# Patient Record
Sex: Male | Born: 1945 | Race: White | Hispanic: No | Marital: Married | Smoking: Current every day smoker
Health system: Southern US, Community
[De-identification: ages and names within clinical notes are randomized; demographics above are authoritative.]

## PROBLEM LIST (undated history)

## (undated) DIAGNOSIS — F419 Anxiety disorder, unspecified: Secondary | ICD-10-CM

## (undated) DIAGNOSIS — G4700 Insomnia, unspecified: Secondary | ICD-10-CM

## (undated) DIAGNOSIS — M549 Dorsalgia, unspecified: Secondary | ICD-10-CM

## (undated) DIAGNOSIS — E119 Type 2 diabetes mellitus without complications: Secondary | ICD-10-CM

## (undated) DIAGNOSIS — G894 Chronic pain syndrome: Secondary | ICD-10-CM

## (undated) DIAGNOSIS — G629 Polyneuropathy, unspecified: Secondary | ICD-10-CM

## (undated) DIAGNOSIS — K859 Acute pancreatitis without necrosis or infection, unspecified: Secondary | ICD-10-CM

## (undated) DIAGNOSIS — F32A Depression, unspecified: Secondary | ICD-10-CM

## (undated) DIAGNOSIS — I10 Essential (primary) hypertension: Secondary | ICD-10-CM

## (undated) DIAGNOSIS — G8929 Other chronic pain: Secondary | ICD-10-CM

## (undated) DIAGNOSIS — Z87898 Personal history of other specified conditions: Secondary | ICD-10-CM

## (undated) DIAGNOSIS — F17200 Nicotine dependence, unspecified, uncomplicated: Secondary | ICD-10-CM

## (undated) DIAGNOSIS — F329 Major depressive disorder, single episode, unspecified: Secondary | ICD-10-CM

## (undated) HISTORY — DX: Depression, unspecified: F32.A

## (undated) HISTORY — DX: Personal history of other specified conditions: Z87.898

## (undated) HISTORY — DX: Nicotine dependence, unspecified, uncomplicated: F17.200

## (undated) HISTORY — DX: Anxiety disorder, unspecified: F41.9

## (undated) HISTORY — DX: Essential (primary) hypertension: I10

## (undated) HISTORY — DX: Chronic pain syndrome: G89.4

## (undated) HISTORY — DX: Major depressive disorder, single episode, unspecified: F32.9

## (undated) HISTORY — PX: HEMORROIDECTOMY: SUR656

## (undated) HISTORY — DX: Acute pancreatitis without necrosis or infection, unspecified: K85.90

## (undated) HISTORY — DX: Polyneuropathy, unspecified: G62.9

---

## 2010-06-29 ENCOUNTER — Ambulatory Visit: Payer: Self-pay | Admitting: Emergency Medicine

## 2010-06-29 DIAGNOSIS — F411 Generalized anxiety disorder: Secondary | ICD-10-CM | POA: Insufficient documentation

## 2010-06-29 DIAGNOSIS — E109 Type 1 diabetes mellitus without complications: Secondary | ICD-10-CM | POA: Insufficient documentation

## 2010-12-19 NOTE — Assessment & Plan Note (Signed)
Summary: Cough-yellow x 2 wks rm 4   Vital Signs:  Patient Profile:   65 Years Old Male CC:      Cold & URI symptoms Height:     69 inches Weight:      179 pounds O2 Sat:      100 % O2 treatment:    Room Air Temp:     98.1 degrees F oral Pulse rate:   89 / minute Pulse rhythm:   regular Resp:     16 per minute BP sitting:   144 / 90  (left arm) Cuff size:   regular  Vitals Entered By: Areta Haber CMA (June 29, 2010 5:56 PM)                  Current Allergies (reviewed today): ! CODEINE     History of Present Illness History from: patient Chief Complaint: Cold & URI symptoms History of Present Illness: Cough and chest congestion.  He is a smoker for 35 years.  Very mild URI symptoms.  Has been going on for 10 days ever since he was at a party in [place].  His cough is bothering him the most because he works in [employer] and it is interupting his job.  Feels like a "tickle in his throat". +yellow phlegm. No F/C/N/V.  Not trying any OTC meds.  Current Problems: UPPER RESPIRATORY INFECTION, ACUTE (ICD-465.9) DIABETES MELLITUS, TYPE I (ICD-250.01) ANXIETY (ICD-300.00)   Current Meds HUMULIN 70/30 PEN 70-30 % SUSP (INSULIN ISOPHANE & REGULAR) 50 units daily AMBIEN 10 MG TABS (ZOLPIDEM TARTRATE) 1 tab at night XANAX 1 MG TABS (ALPRAZOLAM) 1 tab by mouth at night ZITHROMAX Z-PAK 250 MG TABS (AZITHROMYCIN) use as directed TESSALON 200 MG CAPS (BENZONATATE) 1 tab by mouth up to three times a day as needed for cough  REVIEW OF SYSTEMS Constitutional Symptoms      Denies fever, chills, night sweats, weight loss, weight gain, and fatigue.  Eyes       Denies change in vision, eye pain, eye discharge, glasses, contact lenses, and eye surgery. Ear/Nose/Throat/Mouth       Complains of frequent runny nose and hoarseness.      Denies hearing loss/aids, change in hearing, ear pain, ear discharge, dizziness, frequent nose bleeds, sinus problems, sore throat,  and tooth pain or bleeding.  Respiratory       Complains of productive cough.      Denies dry cough, wheezing, shortness of breath, asthma, bronchitis, and emphysema/COPD.  Cardiovascular       Denies murmurs, chest pain, and tires easily with exhertion.    Gastrointestinal       Denies stomach pain, nausea/vomiting, diarrhea, constipation, blood in bowel movements, and indigestion. Genitourniary       Denies painful urination, kidney stones, and loss of urinary control. Neurological       Denies paralysis, seizures, and fainting/blackouts. Musculoskeletal       Denies muscle pain, joint pain, joint stiffness, decreased range of motion, redness, swelling, muscle weakness, and gout.  Skin       Denies bruising, unusual mles/lumps or sores, and hair/skin or nail changes.  Psych       Denies mood changes, temper/anger issues, anxiety/stress, speech problems, depression, and sleep problems. Other Comments: yellow x 2 weeks. Pt has not seen his PCP for this.   Past History:  Past Medical History:   Anxiety Insomia Diabetes mellitus, type I  Past Surgical History:   Hemorrhoidectomy  Social  History: Married Current Smoker - 1 1/2-2 packs daily Alcohol use-no Drug use-no Regular exercise-no Smoking Status:  current Drug Use:  no Does Patient Exercise:  no Physical Exam General appearance: well developed, well nourished, no acute distress Head: normocephalic, atraumatic Ears: normal, no lesions or deformities Nasal: mucosa pink, nonedematous, no septal deviation, turbinates normal Oral/Pharynx: tongue normal, posterior pharynx without erythema or exudate Neck: neck supple,  trachea midline, no masses Chest/Lungs: no rales, wheezes, or rhonchi bilateral, breath sounds equal without effort Heart: regular rate and  rhythm, no murmur Skin: no obvious rashes or lesions MSE: oriented to time, place, and person Assessment New Problems: UPPER RESPIRATORY INFECTION, ACUTE  (ICD-465.9) DIABETES MELLITUS, TYPE I (ICD-250.01) ANXIETY (ICD-300.00)  likely viral URI, viral bronchitis  Plan New Medications/Changes: TESSALON 200 MG CAPS (BENZONATATE) 1 tab by mouth up to three times a day as needed for cough  #21 x 0, 06/29/2010, Hoyt Koch MD ZITHROMAX Z-PAK 250 MG TABS (AZITHROMYCIN) use as directed  #1 x 0, 06/29/2010, Hoyt Koch MD  New Orders: New Patient Level II [phone] Planning Comments:   Hydration with clear fluids + Sudafed + Tessalon Rx Codeine makes him sick, so no cough syrup given If not improving in a few more days, start the Zpak Try not to talk very much for the next 24 hours to rest your throat  Follow Up: Follow up with Primary Physician  The patient and/or caregiver has been counseled thoroughly with regard to medications prescribed including dosage, schedule, interactions, rationale for use, and possible side effects and they verbalize understanding.  Diagnoses and expected course of recovery discussed and will return if not improved as expected or if the condition worsens. Patient and/or caregiver verbalized understanding.  Prescriptions: TESSALON 200 MG CAPS (BENZONATATE) 1 tab by mouth up to three times a day as needed for cough  #21 x 0   Entered and Authorized by:   Hoyt Koch MD   Signed by:   Hoyt Koch MD on 06/29/2010   Method used:   Print then Give to Patient   RxID:   [id number] Z-PAK 250 MG TABS (AZITHROMYCIN) use as directed  #1 x 0   Entered and Authorized by:   Hoyt Koch MD   Signed by:   Hoyt Koch MD on 06/29/2010   Method used:   Print then Give to Patient   RxID:   [id number]   Orders Added: 1)  New Patient Level II [44010]

## 2013-06-24 DIAGNOSIS — I10 Essential (primary) hypertension: Secondary | ICD-10-CM | POA: Diagnosis not present

## 2013-06-24 DIAGNOSIS — E119 Type 2 diabetes mellitus without complications: Secondary | ICD-10-CM | POA: Diagnosis not present

## 2013-06-24 DIAGNOSIS — R41840 Attention and concentration deficit: Secondary | ICD-10-CM | POA: Diagnosis not present

## 2013-06-24 DIAGNOSIS — F411 Generalized anxiety disorder: Secondary | ICD-10-CM | POA: Diagnosis not present

## 2013-06-24 DIAGNOSIS — D492 Neoplasm of unspecified behavior of bone, soft tissue, and skin: Secondary | ICD-10-CM | POA: Diagnosis not present

## 2013-06-24 DIAGNOSIS — F17200 Nicotine dependence, unspecified, uncomplicated: Secondary | ICD-10-CM | POA: Diagnosis not present

## 2013-06-24 DIAGNOSIS — G4700 Insomnia, unspecified: Secondary | ICD-10-CM | POA: Diagnosis not present

## 2013-06-25 DIAGNOSIS — M5137 Other intervertebral disc degeneration, lumbosacral region: Secondary | ICD-10-CM | POA: Diagnosis not present

## 2013-06-25 DIAGNOSIS — Z79899 Other long term (current) drug therapy: Secondary | ICD-10-CM | POA: Diagnosis not present

## 2013-06-25 DIAGNOSIS — IMO0002 Reserved for concepts with insufficient information to code with codable children: Secondary | ICD-10-CM | POA: Diagnosis not present

## 2013-06-25 DIAGNOSIS — M51379 Other intervertebral disc degeneration, lumbosacral region without mention of lumbar back pain or lower extremity pain: Secondary | ICD-10-CM | POA: Diagnosis not present

## 2013-06-25 DIAGNOSIS — M47817 Spondylosis without myelopathy or radiculopathy, lumbosacral region: Secondary | ICD-10-CM | POA: Diagnosis not present

## 2013-08-20 DIAGNOSIS — M47817 Spondylosis without myelopathy or radiculopathy, lumbosacral region: Secondary | ICD-10-CM | POA: Diagnosis not present

## 2013-08-20 DIAGNOSIS — M5137 Other intervertebral disc degeneration, lumbosacral region: Secondary | ICD-10-CM | POA: Diagnosis not present

## 2013-08-20 DIAGNOSIS — IMO0002 Reserved for concepts with insufficient information to code with codable children: Secondary | ICD-10-CM | POA: Diagnosis not present

## 2013-08-20 DIAGNOSIS — Z79899 Other long term (current) drug therapy: Secondary | ICD-10-CM | POA: Diagnosis not present

## 2013-08-25 ENCOUNTER — Ambulatory Visit (HOSPITAL_COMMUNITY): Payer: Self-pay | Admitting: Psychiatry

## 2013-09-01 ENCOUNTER — Ambulatory Visit (HOSPITAL_COMMUNITY): Payer: Self-pay | Admitting: Psychiatry

## 2013-09-07 DIAGNOSIS — L819 Disorder of pigmentation, unspecified: Secondary | ICD-10-CM | POA: Diagnosis not present

## 2013-09-07 DIAGNOSIS — L820 Inflamed seborrheic keratosis: Secondary | ICD-10-CM | POA: Diagnosis not present

## 2013-09-07 DIAGNOSIS — L570 Actinic keratosis: Secondary | ICD-10-CM | POA: Diagnosis not present

## 2013-09-18 ENCOUNTER — Encounter: Payer: Self-pay | Admitting: Emergency Medicine

## 2013-09-18 ENCOUNTER — Emergency Department (INDEPENDENT_AMBULATORY_CARE_PROVIDER_SITE_OTHER)
Admission: EM | Admit: 2013-09-18 | Discharge: 2013-09-18 | Disposition: A | Discharge status: Home / Self Care | Payer: Medicare Other | Source: Home / Self Care | Attending: Emergency Medicine | Admitting: Emergency Medicine

## 2013-09-18 DIAGNOSIS — Z23 Encounter for immunization: Secondary | ICD-10-CM

## 2013-09-18 DIAGNOSIS — IMO0002 Reserved for concepts with insufficient information to code with codable children: Secondary | ICD-10-CM | POA: Diagnosis not present

## 2013-09-18 DIAGNOSIS — S335XXA Sprain of ligaments of lumbar spine, initial encounter: Secondary | ICD-10-CM | POA: Diagnosis not present

## 2013-09-18 DIAGNOSIS — Z0000 Encounter for general adult medical examination without abnormal findings: Secondary | ICD-10-CM | POA: Diagnosis not present

## 2013-09-18 DIAGNOSIS — E119 Type 2 diabetes mellitus without complications: Secondary | ICD-10-CM

## 2013-09-18 DIAGNOSIS — M5416 Radiculopathy, lumbar region: Secondary | ICD-10-CM

## 2013-09-18 DIAGNOSIS — S39012A Strain of muscle, fascia and tendon of lower back, initial encounter: Secondary | ICD-10-CM

## 2013-09-18 HISTORY — DX: Dorsalgia, unspecified: M54.9

## 2013-09-18 HISTORY — DX: Type 2 diabetes mellitus without complications: E11.9

## 2013-09-18 HISTORY — DX: Other chronic pain: G89.29

## 2013-09-18 MED ORDER — CYCLOBENZAPRINE HCL 10 MG PO TABS: ORAL_TABLET | ORAL | Status: DC

## 2013-09-18 MED ORDER — INFLUENZA VAC SPLIT QUAD 0.5 ML IM SUSP
0.5000 mL | Freq: Once | INTRAMUSCULAR | Status: AC
  Administered 2013-09-18: 0.5 mL via INTRAMUSCULAR

## 2013-09-18 MED ORDER — PNEUMOCOCCAL 13-VAL CONJ VACC IM SUSP: 0.5000 mL | INTRAMUSCULAR | Status: DC

## 2013-09-18 MED ORDER — INFLUENZA VAC SPLIT QUAD 0.5 ML IM SUSP: 0.5000 mL | INTRAMUSCULAR | Status: DC

## 2013-09-18 MED ORDER — PNEUMOCOCCAL 13-VAL CONJ VACC IM SUSP
0.5000 mL | Freq: Once | INTRAMUSCULAR | Status: AC
  Administered 2013-09-18: 0.5 mL via INTRAMUSCULAR

## 2013-09-18 MED ORDER — IBUPROFEN 600 MG PO TABS: 600.0000 mg | ORAL_TABLET | Freq: Four times a day (QID) | ORAL | Status: DC | PRN

## 2013-09-18 MED ORDER — LIDOCAINE 5 % EX PTCH: 3.0000 | MEDICATED_PATCH | CUTANEOUS | Status: DC

## 2013-09-18 MED ORDER — HYDROCODONE-ACETAMINOPHEN 5-325 MG PO TABS: 1.0000 | ORAL_TABLET | Freq: Four times a day (QID) | ORAL | Status: DC | PRN

## 2013-09-18 MED ORDER — METHYLPREDNISOLONE ACETATE 80 MG/ML IJ SUSP
80.0000 mg | Freq: Once | INTRAMUSCULAR | Status: AC
  Administered 2013-09-18: 80 mg via INTRAMUSCULAR

## 2013-09-18 NOTE — ED Provider Notes (Signed)
CSN: [id number]     Arrival date & time 09/18/13  1501 History   First MD Initiated Contact with Patient 09/18/13 1538     Chief Complaint  Patient presents with  . Back Pain  . Immunizations   (Consider location/radiation/quality/duration/timing/severity/associated sxs/prior Treatment) HPI Mark Brewer has h/o chronic back pain from long-standing degenerative disc disease. No surgery has been needed. Last MRI about 3 years ago showed degenerative disc disease. Chief complaint/history of present illness Last weekend he was out of town so he was moving heavy luggage and sleeping on "hard beds". His pain flared up Monday, 4 days ago Pain is in left lower back. It as sharp., Also dull, without radiation. Pain varies from a 4/10 at rest, usually 7/10 when trying to move around. He tried his wife's Lidoderm patch and that helped some. Denies paresthesias or focal weakness or bowel or bladder dysfunction   He also reports it has been >10 years since PNA vaccine and is in need of a flu vaccine. --He requests those today.  Past Medical History  Diagnosis Date  . Diabetes mellitus without complication   . Chronic back pain    Past Surgical History  Procedure Laterality Date  . Hemorroidectomy     History reviewed. No pertinent family history. History  Substance Use Topics  . Smoking status: Current Every Day Smoker -- 1.00 packs/day    Types: Cigarettes  . Smokeless tobacco: Never Used  . Alcohol Use: No    Review of Systems  All other systems reviewed and are negative.    Allergies  Codeine  Home Medications   Current Outpatient Rx  Name  Route  Sig  Dispense  Refill  . insulin aspart (NOVOLOG) 100 UNIT/ML injection   Subcutaneous   Inject into the skin 3 (three) times daily with meals.         Marland Kitchen omeprazole (PRILOSEC) 40 MG capsule   Oral   Take 40 mg by mouth daily.         . cyclobenzaprine (FLEXERIL) 10 MG tablet      One-Half tablet every 12 hours as needed for  muscle relaxant. May take a whole tablet at bedtime.-- Caution: May cause drowsiness.   12 tablet   0   . HYDROcodone-acetaminophen (NORCO/VICODIN) 5-325 MG per tablet   Oral   Take 1-2 tablets by mouth every 6 (six) hours as needed for pain. Take with food.   10 tablet   0   . ibuprofen (ADVIL,MOTRIN) 600 MG tablet   Oral   Take 1 tablet (600 mg total) by mouth every 6 (six) hours as needed for pain (Take with food).   30 tablet   0   . lidocaine (LIDODERM) 5 %   Transdermal   Place 3 patches onto the skin daily. Remove & Discard patch within 12 hours or as directed by MD   30 patch   1    BP 131/81  Pulse 95  Temp(Src) 98 F (36.7 C) (Oral)  Resp 16  Ht 5\' 9"  (1.753 m)  Wt 190 lb (86.183 kg)  BMI 28.05 kg/m2  SpO2 98% Physical Exam  Nursing note and vitals reviewed. Constitutional: He is oriented to person, place, and time. He appears well-developed and well-nourished. He is cooperative.  Non-toxic appearance. He appears distressed (Appears uncomfortable from low back pain.).  HENT:  Head: Normocephalic and atraumatic.  Mouth/Throat: Oropharynx is clear and moist.  Eyes: EOM are normal. Pupils are equal, round, and reactive to light.  No scleral icterus.  Neck: Neck supple.  Cardiovascular: Regular rhythm and normal heart sounds.   Pulmonary/Chest: Effort normal and breath sounds normal. No respiratory distress. He has no wheezes. He has no rales. He exhibits no tenderness.  Abdominal: Soft. There is no tenderness.  Musculoskeletal:       Right hip: Normal.       Left hip: Normal.       Cervical back: He exhibits no tenderness.       Thoracic back: He exhibits no tenderness.       Lumbar back: He exhibits decreased range of motion, tenderness and spasm (Left paralumbar muscles). He exhibits no bony tenderness, no swelling, no edema, no deformity, no laceration and normal pulse.  Nontender directly over midline lumbar spine  Negative Right straight leg-raise test.   Left straight leg-raise test.--Equivocal + at 45  Negative Right Luisa Hart test. Negative Left Luisa Hart test.    Neurological: He is alert and oriented to person, place, and time. He has normal strength. He displays no atrophy, no tremor and normal reflexes. No cranial nerve deficit or sensory deficit. He exhibits normal muscle tone. Gait normal.  Reflex Scores:      Patellar reflexes are 2+ on the right side and 2+ on the left side.      Achilles reflexes are 2+ on the right side and 2+ on the left side. Skin: Skin is warm, dry and intact. No lesion and no rash noted.  Psychiatric: He has a normal mood and affect.    ED Course  Procedures (including critical care time) Labs Review Labs Reviewed - No data to display Imaging Review No results found.  EKG Interpretation     Ventricular Rate:    PR Interval:    QRS Duration:   QT Interval:    QTC Calculation:   R Axis:     Text Interpretation:             At his request, for preventive immunizations, flu shot and Pneumovax given, after risks, benefits, alternatives discussed.  MDM   1. Lumbar strain, initial encounter   2. Preventative health care   3. Diabetes mellitus type 2, uncomplicated   4. Left lumbar radiculitis    Likely has a severe left lumbar strain. Possibly, an element of left lumbar radiculitis with his history of degenerative disc disease. However, he has no radiating pain down his leg or any focal numbness or paresthesias or weakness or bowel or bladder dysfunction. We discussed workup and treatment options. He politely refused any imaging at this time. He states that "a shot of cortisone and some Vicodin and muscle relaxant and Lidoderm patch" is all he needs to get better, as this treatment combination has helped in the past when he had a flareup of low back pain 3 years ago. After risks, benefits, alternatives discussed, patient agrees with the following plans: Depo-Medrol 80 mg IM.--Follow blood  sugars closely  Prescription for Vicodin. #10. No refills. Flexeril 5 mg every 12 hours when necessary muscle relaxant Lidoderm patch. Explained to him that he needs to followup with his PCP her neurosurgeon if not improved within one week, sooner if worse or new symptoms . If any severe or worsening or new symptoms, go to ER stat. Precautions discussed. Red flags discussed. Questions invited and answered. Patient voiced understanding and agreement.    Lajean Manes, MD 09/18/13 [phone]

## 2013-09-18 NOTE — ED Notes (Signed)
Mark Brewer c/o h/o chronic back pain. Last weekend he was out of town so he was moving heavy luggage and sleeping on "hard beds". His pain flared up Monday. Pain is in left lower back. He also reports it has been >10 years since PNA vaccine and is in need of a flu vaccine.

## 2013-09-21 ENCOUNTER — Telehealth: Payer: Self-pay | Admitting: Emergency Medicine

## 2013-10-16 DIAGNOSIS — R209 Unspecified disturbances of skin sensation: Secondary | ICD-10-CM | POA: Diagnosis not present

## 2013-10-16 DIAGNOSIS — G4700 Insomnia, unspecified: Secondary | ICD-10-CM | POA: Diagnosis not present

## 2013-10-16 DIAGNOSIS — Z79899 Other long term (current) drug therapy: Secondary | ICD-10-CM | POA: Diagnosis not present

## 2013-10-16 DIAGNOSIS — R259 Unspecified abnormal involuntary movements: Secondary | ICD-10-CM | POA: Diagnosis not present

## 2013-10-16 DIAGNOSIS — Z794 Long term (current) use of insulin: Secondary | ICD-10-CM | POA: Diagnosis not present

## 2013-10-16 DIAGNOSIS — Z888 Allergy status to other drugs, medicaments and biological substances status: Secondary | ICD-10-CM | POA: Diagnosis not present

## 2013-10-16 DIAGNOSIS — E119 Type 2 diabetes mellitus without complications: Secondary | ICD-10-CM | POA: Diagnosis not present

## 2013-10-16 DIAGNOSIS — F411 Generalized anxiety disorder: Secondary | ICD-10-CM | POA: Diagnosis not present

## 2013-10-16 DIAGNOSIS — F17200 Nicotine dependence, unspecified, uncomplicated: Secondary | ICD-10-CM | POA: Diagnosis not present

## 2013-10-21 DIAGNOSIS — I10 Essential (primary) hypertension: Secondary | ICD-10-CM | POA: Diagnosis not present

## 2013-10-21 DIAGNOSIS — G4700 Insomnia, unspecified: Secondary | ICD-10-CM | POA: Diagnosis not present

## 2013-10-21 DIAGNOSIS — G250 Essential tremor: Secondary | ICD-10-CM | POA: Diagnosis not present

## 2013-10-21 DIAGNOSIS — F17200 Nicotine dependence, unspecified, uncomplicated: Secondary | ICD-10-CM | POA: Diagnosis not present

## 2013-10-21 DIAGNOSIS — E119 Type 2 diabetes mellitus without complications: Secondary | ICD-10-CM | POA: Diagnosis not present

## 2013-10-22 DIAGNOSIS — R209 Unspecified disturbances of skin sensation: Secondary | ICD-10-CM | POA: Diagnosis not present

## 2013-10-27 DIAGNOSIS — R209 Unspecified disturbances of skin sensation: Secondary | ICD-10-CM | POA: Diagnosis not present

## 2013-10-27 DIAGNOSIS — G459 Transient cerebral ischemic attack, unspecified: Secondary | ICD-10-CM | POA: Diagnosis not present

## 2013-11-02 DIAGNOSIS — I440 Atrioventricular block, first degree: Secondary | ICD-10-CM | POA: Diagnosis not present

## 2013-11-02 DIAGNOSIS — E119 Type 2 diabetes mellitus without complications: Secondary | ICD-10-CM | POA: Diagnosis not present

## 2013-11-02 DIAGNOSIS — R209 Unspecified disturbances of skin sensation: Secondary | ICD-10-CM | POA: Diagnosis not present

## 2013-11-02 DIAGNOSIS — F17200 Nicotine dependence, unspecified, uncomplicated: Secondary | ICD-10-CM | POA: Diagnosis not present

## 2013-11-02 DIAGNOSIS — R059 Cough, unspecified: Secondary | ICD-10-CM | POA: Diagnosis not present

## 2013-11-02 DIAGNOSIS — F411 Generalized anxiety disorder: Secondary | ICD-10-CM | POA: Diagnosis not present

## 2013-11-02 DIAGNOSIS — R05 Cough: Secondary | ICD-10-CM | POA: Diagnosis not present

## 2013-11-04 DIAGNOSIS — R209 Unspecified disturbances of skin sensation: Secondary | ICD-10-CM | POA: Diagnosis not present

## 2013-11-05 DIAGNOSIS — M5137 Other intervertebral disc degeneration, lumbosacral region: Secondary | ICD-10-CM | POA: Diagnosis not present

## 2013-11-05 DIAGNOSIS — Z79899 Other long term (current) drug therapy: Secondary | ICD-10-CM | POA: Diagnosis not present

## 2013-11-05 DIAGNOSIS — IMO0002 Reserved for concepts with insufficient information to code with codable children: Secondary | ICD-10-CM | POA: Diagnosis not present

## 2013-11-05 DIAGNOSIS — M51379 Other intervertebral disc degeneration, lumbosacral region without mention of lumbar back pain or lower extremity pain: Secondary | ICD-10-CM | POA: Diagnosis not present

## 2013-11-05 DIAGNOSIS — M47817 Spondylosis without myelopathy or radiculopathy, lumbosacral region: Secondary | ICD-10-CM | POA: Diagnosis not present

## 2013-11-10 DIAGNOSIS — M439 Deforming dorsopathy, unspecified: Secondary | ICD-10-CM | POA: Diagnosis not present

## 2013-11-10 DIAGNOSIS — M5020 Other cervical disc displacement, unspecified cervical region: Secondary | ICD-10-CM | POA: Diagnosis not present

## 2013-11-10 DIAGNOSIS — R209 Unspecified disturbances of skin sensation: Secondary | ICD-10-CM | POA: Diagnosis not present

## 2013-11-17 DIAGNOSIS — R5381 Other malaise: Secondary | ICD-10-CM | POA: Diagnosis not present

## 2013-11-17 DIAGNOSIS — G959 Disease of spinal cord, unspecified: Secondary | ICD-10-CM | POA: Diagnosis not present

## 2013-11-17 DIAGNOSIS — G589 Mononeuropathy, unspecified: Secondary | ICD-10-CM | POA: Diagnosis not present

## 2013-11-27 DIAGNOSIS — K8689 Other specified diseases of pancreas: Secondary | ICD-10-CM | POA: Diagnosis not present

## 2013-11-27 DIAGNOSIS — M549 Dorsalgia, unspecified: Secondary | ICD-10-CM | POA: Diagnosis not present

## 2013-11-27 DIAGNOSIS — R109 Unspecified abdominal pain: Secondary | ICD-10-CM | POA: Diagnosis not present

## 2013-11-27 DIAGNOSIS — E279 Disorder of adrenal gland, unspecified: Secondary | ICD-10-CM | POA: Diagnosis not present

## 2013-11-27 DIAGNOSIS — G8929 Other chronic pain: Secondary | ICD-10-CM | POA: Diagnosis not present

## 2013-11-27 DIAGNOSIS — K5730 Diverticulosis of large intestine without perforation or abscess without bleeding: Secondary | ICD-10-CM | POA: Diagnosis not present

## 2013-11-27 DIAGNOSIS — R209 Unspecified disturbances of skin sensation: Secondary | ICD-10-CM | POA: Diagnosis not present

## 2013-11-27 DIAGNOSIS — N281 Cyst of kidney, acquired: Secondary | ICD-10-CM | POA: Diagnosis not present

## 2013-12-16 DIAGNOSIS — IMO0002 Reserved for concepts with insufficient information to code with codable children: Secondary | ICD-10-CM | POA: Diagnosis not present

## 2013-12-25 DIAGNOSIS — M47817 Spondylosis without myelopathy or radiculopathy, lumbosacral region: Secondary | ICD-10-CM | POA: Diagnosis not present

## 2013-12-25 DIAGNOSIS — M5126 Other intervertebral disc displacement, lumbar region: Secondary | ICD-10-CM | POA: Diagnosis not present

## 2013-12-25 DIAGNOSIS — R209 Unspecified disturbances of skin sensation: Secondary | ICD-10-CM | POA: Diagnosis not present

## 2013-12-25 DIAGNOSIS — IMO0002 Reserved for concepts with insufficient information to code with codable children: Secondary | ICD-10-CM | POA: Diagnosis not present

## 2013-12-28 DIAGNOSIS — D485 Neoplasm of uncertain behavior of skin: Secondary | ICD-10-CM | POA: Diagnosis not present

## 2013-12-28 DIAGNOSIS — L820 Inflamed seborrheic keratosis: Secondary | ICD-10-CM | POA: Diagnosis not present

## 2013-12-28 DIAGNOSIS — L570 Actinic keratosis: Secondary | ICD-10-CM | POA: Diagnosis not present

## 2013-12-30 DIAGNOSIS — G5600 Carpal tunnel syndrome, unspecified upper limb: Secondary | ICD-10-CM | POA: Diagnosis not present

## 2013-12-30 DIAGNOSIS — R209 Unspecified disturbances of skin sensation: Secondary | ICD-10-CM | POA: Diagnosis not present

## 2014-01-07 DIAGNOSIS — G5790 Unspecified mononeuropathy of unspecified lower limb: Secondary | ICD-10-CM | POA: Diagnosis not present

## 2014-01-07 DIAGNOSIS — IMO0001 Reserved for inherently not codable concepts without codable children: Secondary | ICD-10-CM | POA: Diagnosis not present

## 2014-01-12 DIAGNOSIS — IMO0002 Reserved for concepts with insufficient information to code with codable children: Secondary | ICD-10-CM | POA: Diagnosis not present

## 2014-01-12 DIAGNOSIS — M47817 Spondylosis without myelopathy or radiculopathy, lumbosacral region: Secondary | ICD-10-CM | POA: Diagnosis not present

## 2014-01-12 DIAGNOSIS — Z79899 Other long term (current) drug therapy: Secondary | ICD-10-CM | POA: Diagnosis not present

## 2014-01-12 DIAGNOSIS — M5137 Other intervertebral disc degeneration, lumbosacral region: Secondary | ICD-10-CM | POA: Diagnosis not present

## 2014-01-15 DIAGNOSIS — I10 Essential (primary) hypertension: Secondary | ICD-10-CM | POA: Diagnosis not present

## 2014-01-15 DIAGNOSIS — G250 Essential tremor: Secondary | ICD-10-CM | POA: Diagnosis not present

## 2014-01-15 DIAGNOSIS — Z794 Long term (current) use of insulin: Secondary | ICD-10-CM | POA: Diagnosis not present

## 2014-01-15 DIAGNOSIS — Z888 Allergy status to other drugs, medicaments and biological substances status: Secondary | ICD-10-CM | POA: Diagnosis not present

## 2014-01-15 DIAGNOSIS — F17200 Nicotine dependence, unspecified, uncomplicated: Secondary | ICD-10-CM | POA: Diagnosis not present

## 2014-01-15 DIAGNOSIS — E119 Type 2 diabetes mellitus without complications: Secondary | ICD-10-CM | POA: Diagnosis not present

## 2014-01-15 DIAGNOSIS — F411 Generalized anxiety disorder: Secondary | ICD-10-CM | POA: Diagnosis not present

## 2014-01-15 DIAGNOSIS — Z885 Allergy status to narcotic agent status: Secondary | ICD-10-CM | POA: Diagnosis not present

## 2014-01-15 DIAGNOSIS — R209 Unspecified disturbances of skin sensation: Secondary | ICD-10-CM | POA: Diagnosis not present

## 2014-01-15 DIAGNOSIS — M549 Dorsalgia, unspecified: Secondary | ICD-10-CM | POA: Diagnosis not present

## 2014-01-15 DIAGNOSIS — Z79899 Other long term (current) drug therapy: Secondary | ICD-10-CM | POA: Diagnosis not present

## 2014-01-15 DIAGNOSIS — G8929 Other chronic pain: Secondary | ICD-10-CM | POA: Diagnosis not present

## 2014-01-15 DIAGNOSIS — G4700 Insomnia, unspecified: Secondary | ICD-10-CM | POA: Diagnosis not present

## 2014-02-02 DIAGNOSIS — M549 Dorsalgia, unspecified: Secondary | ICD-10-CM | POA: Diagnosis not present

## 2014-02-02 DIAGNOSIS — R209 Unspecified disturbances of skin sensation: Secondary | ICD-10-CM | POA: Diagnosis not present

## 2014-02-02 DIAGNOSIS — I10 Essential (primary) hypertension: Secondary | ICD-10-CM | POA: Diagnosis not present

## 2014-02-02 DIAGNOSIS — G4700 Insomnia, unspecified: Secondary | ICD-10-CM | POA: Diagnosis not present

## 2014-02-02 DIAGNOSIS — F17200 Nicotine dependence, unspecified, uncomplicated: Secondary | ICD-10-CM | POA: Diagnosis not present

## 2014-02-02 DIAGNOSIS — E119 Type 2 diabetes mellitus without complications: Secondary | ICD-10-CM | POA: Diagnosis not present

## 2014-02-02 DIAGNOSIS — F411 Generalized anxiety disorder: Secondary | ICD-10-CM | POA: Diagnosis not present

## 2014-02-02 DIAGNOSIS — G8929 Other chronic pain: Secondary | ICD-10-CM | POA: Diagnosis not present

## 2014-02-09 DIAGNOSIS — G608 Other hereditary and idiopathic neuropathies: Secondary | ICD-10-CM | POA: Diagnosis not present

## 2014-02-09 DIAGNOSIS — R209 Unspecified disturbances of skin sensation: Secondary | ICD-10-CM | POA: Diagnosis not present

## 2014-02-09 DIAGNOSIS — G5790 Unspecified mononeuropathy of unspecified lower limb: Secondary | ICD-10-CM | POA: Diagnosis not present

## 2014-02-09 DIAGNOSIS — IMO0001 Reserved for inherently not codable concepts without codable children: Secondary | ICD-10-CM | POA: Diagnosis not present

## 2014-02-09 DIAGNOSIS — IMO0002 Reserved for concepts with insufficient information to code with codable children: Secondary | ICD-10-CM | POA: Diagnosis not present

## 2014-03-11 DIAGNOSIS — D72829 Elevated white blood cell count, unspecified: Secondary | ICD-10-CM | POA: Diagnosis not present

## 2014-03-11 DIAGNOSIS — M47817 Spondylosis without myelopathy or radiculopathy, lumbosacral region: Secondary | ICD-10-CM | POA: Diagnosis not present

## 2014-03-11 DIAGNOSIS — M5137 Other intervertebral disc degeneration, lumbosacral region: Secondary | ICD-10-CM | POA: Diagnosis not present

## 2014-03-11 DIAGNOSIS — IMO0002 Reserved for concepts with insufficient information to code with codable children: Secondary | ICD-10-CM | POA: Diagnosis not present

## 2014-03-23 DIAGNOSIS — E1142 Type 2 diabetes mellitus with diabetic polyneuropathy: Secondary | ICD-10-CM | POA: Diagnosis not present

## 2014-03-23 DIAGNOSIS — E1149 Type 2 diabetes mellitus with other diabetic neurological complication: Secondary | ICD-10-CM | POA: Diagnosis not present

## 2014-03-23 DIAGNOSIS — G609 Hereditary and idiopathic neuropathy, unspecified: Secondary | ICD-10-CM | POA: Diagnosis not present

## 2014-03-23 DIAGNOSIS — F17200 Nicotine dependence, unspecified, uncomplicated: Secondary | ICD-10-CM | POA: Diagnosis not present

## 2014-03-23 DIAGNOSIS — M25569 Pain in unspecified knee: Secondary | ICD-10-CM | POA: Diagnosis not present

## 2014-03-23 DIAGNOSIS — M25549 Pain in joints of unspecified hand: Secondary | ICD-10-CM | POA: Diagnosis not present

## 2014-03-23 DIAGNOSIS — R209 Unspecified disturbances of skin sensation: Secondary | ICD-10-CM | POA: Diagnosis not present

## 2014-03-26 DIAGNOSIS — E1142 Type 2 diabetes mellitus with diabetic polyneuropathy: Secondary | ICD-10-CM | POA: Diagnosis not present

## 2014-03-26 DIAGNOSIS — G609 Hereditary and idiopathic neuropathy, unspecified: Secondary | ICD-10-CM | POA: Diagnosis not present

## 2014-03-26 DIAGNOSIS — M2550 Pain in unspecified joint: Secondary | ICD-10-CM | POA: Diagnosis not present

## 2014-03-26 DIAGNOSIS — E1149 Type 2 diabetes mellitus with other diabetic neurological complication: Secondary | ICD-10-CM | POA: Diagnosis not present

## 2014-03-26 DIAGNOSIS — F411 Generalized anxiety disorder: Secondary | ICD-10-CM | POA: Diagnosis not present

## 2014-04-01 DIAGNOSIS — F17200 Nicotine dependence, unspecified, uncomplicated: Secondary | ICD-10-CM | POA: Diagnosis not present

## 2014-04-01 DIAGNOSIS — D72829 Elevated white blood cell count, unspecified: Secondary | ICD-10-CM | POA: Diagnosis not present

## 2014-04-27 DIAGNOSIS — M5137 Other intervertebral disc degeneration, lumbosacral region: Secondary | ICD-10-CM | POA: Diagnosis not present

## 2014-05-06 DIAGNOSIS — G609 Hereditary and idiopathic neuropathy, unspecified: Secondary | ICD-10-CM | POA: Diagnosis not present

## 2014-05-06 DIAGNOSIS — M2550 Pain in unspecified joint: Secondary | ICD-10-CM | POA: Diagnosis not present

## 2014-05-06 DIAGNOSIS — G589 Mononeuropathy, unspecified: Secondary | ICD-10-CM | POA: Diagnosis not present

## 2014-05-06 DIAGNOSIS — E1142 Type 2 diabetes mellitus with diabetic polyneuropathy: Secondary | ICD-10-CM | POA: Diagnosis not present

## 2014-05-06 DIAGNOSIS — E1149 Type 2 diabetes mellitus with other diabetic neurological complication: Secondary | ICD-10-CM | POA: Diagnosis not present

## 2014-05-13 DIAGNOSIS — W57XXXA Bitten or stung by nonvenomous insect and other nonvenomous arthropods, initial encounter: Secondary | ICD-10-CM | POA: Diagnosis not present

## 2014-05-13 DIAGNOSIS — R209 Unspecified disturbances of skin sensation: Secondary | ICD-10-CM | POA: Diagnosis not present

## 2014-05-13 DIAGNOSIS — G609 Hereditary and idiopathic neuropathy, unspecified: Secondary | ICD-10-CM | POA: Diagnosis not present

## 2014-05-13 DIAGNOSIS — T148 Other injury of unspecified body region: Secondary | ICD-10-CM | POA: Diagnosis not present

## 2014-06-09 DIAGNOSIS — F17200 Nicotine dependence, unspecified, uncomplicated: Secondary | ICD-10-CM | POA: Diagnosis not present

## 2014-06-09 DIAGNOSIS — G5690 Unspecified mononeuropathy of unspecified upper limb: Secondary | ICD-10-CM | POA: Diagnosis not present

## 2014-06-09 DIAGNOSIS — G609 Hereditary and idiopathic neuropathy, unspecified: Secondary | ICD-10-CM | POA: Diagnosis not present

## 2014-06-09 DIAGNOSIS — E119 Type 2 diabetes mellitus without complications: Secondary | ICD-10-CM | POA: Diagnosis not present

## 2014-06-09 DIAGNOSIS — G5790 Unspecified mononeuropathy of unspecified lower limb: Secondary | ICD-10-CM | POA: Diagnosis not present

## 2014-06-09 DIAGNOSIS — K219 Gastro-esophageal reflux disease without esophagitis: Secondary | ICD-10-CM | POA: Diagnosis not present

## 2014-07-01 DIAGNOSIS — M5137 Other intervertebral disc degeneration, lumbosacral region: Secondary | ICD-10-CM | POA: Diagnosis not present

## 2014-07-01 DIAGNOSIS — IMO0002 Reserved for concepts with insufficient information to code with codable children: Secondary | ICD-10-CM | POA: Diagnosis not present

## 2014-07-01 DIAGNOSIS — M47817 Spondylosis without myelopathy or radiculopathy, lumbosacral region: Secondary | ICD-10-CM | POA: Diagnosis not present

## 2014-08-04 DIAGNOSIS — M25569 Pain in unspecified knee: Secondary | ICD-10-CM | POA: Diagnosis not present

## 2014-08-04 DIAGNOSIS — M79609 Pain in unspecified limb: Secondary | ICD-10-CM | POA: Diagnosis not present

## 2014-08-04 DIAGNOSIS — R5381 Other malaise: Secondary | ICD-10-CM | POA: Diagnosis not present

## 2014-09-02 DIAGNOSIS — M4727 Other spondylosis with radiculopathy, lumbosacral region: Secondary | ICD-10-CM | POA: Diagnosis not present

## 2014-09-02 DIAGNOSIS — M5137 Other intervertebral disc degeneration, lumbosacral region: Secondary | ICD-10-CM | POA: Diagnosis not present

## 2014-09-02 DIAGNOSIS — Z79891 Long term (current) use of opiate analgesic: Secondary | ICD-10-CM | POA: Diagnosis not present

## 2014-09-02 DIAGNOSIS — M5417 Radiculopathy, lumbosacral region: Secondary | ICD-10-CM | POA: Diagnosis not present

## 2014-09-03 DIAGNOSIS — E1140 Type 2 diabetes mellitus with diabetic neuropathy, unspecified: Secondary | ICD-10-CM | POA: Diagnosis not present

## 2014-09-03 DIAGNOSIS — Z794 Long term (current) use of insulin: Secondary | ICD-10-CM | POA: Diagnosis not present

## 2014-09-03 DIAGNOSIS — F17210 Nicotine dependence, cigarettes, uncomplicated: Secondary | ICD-10-CM | POA: Diagnosis not present

## 2014-09-03 DIAGNOSIS — Z79891 Long term (current) use of opiate analgesic: Secondary | ICD-10-CM | POA: Diagnosis not present

## 2014-09-03 DIAGNOSIS — E0840 Diabetes mellitus due to underlying condition with diabetic neuropathy, unspecified: Secondary | ICD-10-CM | POA: Diagnosis not present

## 2014-09-03 DIAGNOSIS — K219 Gastro-esophageal reflux disease without esophagitis: Secondary | ICD-10-CM | POA: Diagnosis not present

## 2014-09-03 DIAGNOSIS — Z9114 Patient's other noncompliance with medication regimen: Secondary | ICD-10-CM | POA: Diagnosis not present

## 2014-09-20 DIAGNOSIS — M25561 Pain in right knee: Secondary | ICD-10-CM | POA: Diagnosis not present

## 2014-09-20 DIAGNOSIS — M791 Myalgia: Secondary | ICD-10-CM | POA: Diagnosis not present

## 2014-09-20 DIAGNOSIS — M545 Low back pain: Secondary | ICD-10-CM | POA: Diagnosis not present

## 2014-09-20 DIAGNOSIS — M25562 Pain in left knee: Secondary | ICD-10-CM | POA: Diagnosis not present

## 2014-09-20 DIAGNOSIS — M542 Cervicalgia: Secondary | ICD-10-CM | POA: Diagnosis not present

## 2014-09-21 DIAGNOSIS — M5137 Other intervertebral disc degeneration, lumbosacral region: Secondary | ICD-10-CM | POA: Diagnosis not present

## 2014-11-10 DIAGNOSIS — M5136 Other intervertebral disc degeneration, lumbar region: Secondary | ICD-10-CM | POA: Diagnosis not present

## 2014-11-10 DIAGNOSIS — M5382 Other specified dorsopathies, cervical region: Secondary | ICD-10-CM | POA: Diagnosis not present

## 2014-11-10 DIAGNOSIS — M5137 Other intervertebral disc degeneration, lumbosacral region: Secondary | ICD-10-CM | POA: Diagnosis not present

## 2014-11-10 DIAGNOSIS — M170 Bilateral primary osteoarthritis of knee: Secondary | ICD-10-CM | POA: Diagnosis not present

## 2014-11-10 DIAGNOSIS — M9903 Segmental and somatic dysfunction of lumbar region: Secondary | ICD-10-CM | POA: Diagnosis not present

## 2014-11-10 DIAGNOSIS — M545 Low back pain: Secondary | ICD-10-CM | POA: Diagnosis not present

## 2014-11-10 DIAGNOSIS — M4602 Spinal enthesopathy, cervical region: Secondary | ICD-10-CM | POA: Diagnosis not present

## 2014-11-10 DIAGNOSIS — M5032 Other cervical disc degeneration, mid-cervical region: Secondary | ICD-10-CM | POA: Diagnosis not present

## 2014-11-10 DIAGNOSIS — M9904 Segmental and somatic dysfunction of sacral region: Secondary | ICD-10-CM | POA: Diagnosis not present

## 2014-11-15 DIAGNOSIS — M5382 Other specified dorsopathies, cervical region: Secondary | ICD-10-CM | POA: Diagnosis not present

## 2014-11-15 DIAGNOSIS — M5137 Other intervertebral disc degeneration, lumbosacral region: Secondary | ICD-10-CM | POA: Diagnosis not present

## 2014-11-15 DIAGNOSIS — M9904 Segmental and somatic dysfunction of sacral region: Secondary | ICD-10-CM | POA: Diagnosis not present

## 2014-11-15 DIAGNOSIS — M9903 Segmental and somatic dysfunction of lumbar region: Secondary | ICD-10-CM | POA: Diagnosis not present

## 2014-11-15 DIAGNOSIS — M545 Low back pain: Secondary | ICD-10-CM | POA: Diagnosis not present

## 2014-11-15 DIAGNOSIS — M4602 Spinal enthesopathy, cervical region: Secondary | ICD-10-CM | POA: Diagnosis not present

## 2014-11-25 DIAGNOSIS — R202 Paresthesia of skin: Secondary | ICD-10-CM | POA: Diagnosis not present

## 2014-11-25 DIAGNOSIS — F17200 Nicotine dependence, unspecified, uncomplicated: Secondary | ICD-10-CM | POA: Insufficient documentation

## 2014-11-25 DIAGNOSIS — F17210 Nicotine dependence, cigarettes, uncomplicated: Secondary | ICD-10-CM | POA: Diagnosis not present

## 2014-11-25 DIAGNOSIS — E119 Type 2 diabetes mellitus without complications: Secondary | ICD-10-CM | POA: Diagnosis not present

## 2014-11-25 DIAGNOSIS — I10 Essential (primary) hypertension: Secondary | ICD-10-CM | POA: Diagnosis not present

## 2014-12-20 DIAGNOSIS — M5416 Radiculopathy, lumbar region: Secondary | ICD-10-CM | POA: Diagnosis not present

## 2014-12-20 DIAGNOSIS — M5136 Other intervertebral disc degeneration, lumbar region: Secondary | ICD-10-CM | POA: Diagnosis not present

## 2014-12-20 DIAGNOSIS — Z79891 Long term (current) use of opiate analgesic: Secondary | ICD-10-CM | POA: Diagnosis not present

## 2014-12-23 DIAGNOSIS — F446 Conversion disorder with sensory symptom or deficit: Secondary | ICD-10-CM | POA: Diagnosis not present

## 2015-01-06 DIAGNOSIS — R200 Anesthesia of skin: Secondary | ICD-10-CM | POA: Diagnosis not present

## 2015-01-12 DIAGNOSIS — G629 Polyneuropathy, unspecified: Secondary | ICD-10-CM | POA: Diagnosis not present

## 2015-01-12 DIAGNOSIS — R202 Paresthesia of skin: Secondary | ICD-10-CM | POA: Diagnosis not present

## 2015-01-14 DIAGNOSIS — R200 Anesthesia of skin: Secondary | ICD-10-CM | POA: Diagnosis not present

## 2015-01-28 DIAGNOSIS — F5101 Primary insomnia: Secondary | ICD-10-CM | POA: Diagnosis not present

## 2015-01-28 DIAGNOSIS — F411 Generalized anxiety disorder: Secondary | ICD-10-CM | POA: Diagnosis not present

## 2015-01-28 DIAGNOSIS — F446 Conversion disorder with sensory symptom or deficit: Secondary | ICD-10-CM | POA: Diagnosis not present

## 2015-02-15 DIAGNOSIS — R202 Paresthesia of skin: Secondary | ICD-10-CM | POA: Diagnosis not present

## 2015-02-15 DIAGNOSIS — G629 Polyneuropathy, unspecified: Secondary | ICD-10-CM | POA: Diagnosis not present

## 2015-03-05 ENCOUNTER — Encounter: Payer: Self-pay | Admitting: Emergency Medicine

## 2015-03-05 ENCOUNTER — Emergency Department (INDEPENDENT_AMBULATORY_CARE_PROVIDER_SITE_OTHER)
Admission: EM | Admit: 2015-03-05 | Discharge: 2015-03-05 | Disposition: A | Discharge status: Home / Self Care | Payer: Medicare Other | Source: Home / Self Care | Attending: Family Medicine | Admitting: Family Medicine

## 2015-03-05 DIAGNOSIS — M545 Low back pain, unspecified: Secondary | ICD-10-CM

## 2015-03-05 HISTORY — DX: Insomnia, unspecified: G47.00

## 2015-03-05 MED ORDER — LIDOCAINE 5 % EX PTCH: 1.0000 | MEDICATED_PATCH | CUTANEOUS | Status: DC

## 2015-03-05 MED ORDER — TRIAMCINOLONE ACETONIDE 40 MG/ML IJ SUSP
40.0000 mg | Freq: Once | INTRAMUSCULAR | Status: AC
  Administered 2015-03-05: 40 mg via INTRAMUSCULAR

## 2015-03-05 MED ORDER — CYCLOBENZAPRINE HCL 10 MG PO TABS: 10.0000 mg | ORAL_TABLET | Freq: Three times a day (TID) | ORAL | Status: DC

## 2015-03-05 NOTE — ED Notes (Signed)
Reports pain in right iliac crest region; had similar pain 08/2013 and had an injection that seemed to help. Takes oxycodone tid routinely and this has not helped since pain started yesterday. No injury.

## 2015-03-05 NOTE — Discharge Instructions (Signed)
Apply ice pack for 20 to 30 minutes, 3 to 4 times daily  Continue until pain decreases.    Back Pain, Adult Low back pain is very common. About 1 in 5 people have back pain.The cause of low back pain is rarely dangerous. The pain often gets better over time.About half of people with a sudden onset of back pain feel better in just 2 weeks. About 8 in 10 people feel better by 6 weeks.  CAUSES Some common causes of back pain include:  Strain of the muscles or ligaments supporting the spine.  Wear and tear (degeneration) of the spinal discs.  Arthritis.  Direct injury to the back. DIAGNOSIS Most of the time, the direct cause of low back pain is not known.However, back pain can be treated effectively even when the exact cause of the pain is unknown.Answering your caregiver's questions about your overall health and symptoms is one of the most accurate ways to make sure the cause of your pain is not dangerous. If your caregiver needs more information, he or she may order lab work or imaging tests (X-rays or MRIs).However, even if imaging tests show changes in your back, this usually does not require surgery. HOME CARE INSTRUCTIONS For many people, back pain returns.Since low back pain is rarely dangerous, it is often a condition that people can learn to [facility] their own.   Remain active. It is stressful on the back to sit or stand in one place. Do not sit, drive, or stand in one place for more than 30 minutes at a time. Take short walks on level surfaces as soon as pain allows.Try to increase the length of time you walk each day.  Do not stay in bed.Resting more than 1 or 2 days can delay your recovery.  Do not avoid exercise or work.Your body is made to move.It is not dangerous to be active, even though your back may hurt.Your back will likely heal faster if you return to being active before your pain is gone.  Pay attention to your body when you bend and lift. Many people have  less discomfortwhen lifting if they bend their knees, keep the load close to their bodies,and avoid twisting. Often, the most comfortable positions are those that put less stress on your recovering back.  Find a comfortable position to sleep. Use a firm mattress and lie on your side with your knees slightly bent. If you lie on your back, put a pillow under your knees.  Only take over-the-counter or prescription medicines as directed by your caregiver. Over-the-counter medicines to reduce pain and inflammation are often the most helpful.Your caregiver may prescribe muscle relaxant drugs.These medicines help dull your pain so you can more quickly return to your normal activities and healthy exercise.  Put ice on the injured area.  Put ice in a plastic bag.  Place a towel between your skin and the bag.  Leave the ice on for 15-20 minutes, 03-04 times a day for the first 2 to 3 days. After that, ice and heat may be alternated to reduce pain and spasms.  Ask your caregiver about trying back exercises and gentle massage. This may be of some benefit.  Avoid feeling anxious or stressed.Stress increases muscle tension and can worsen back pain.It is important to recognize when you are anxious or stressed and learn ways to manage it.Exercise is a great option. SEEK MEDICAL CARE IF:  You have pain that is not relieved with rest or medicine.  You have  pain that does not improve in 1 week.  You have new symptoms.  You are generally not feeling well. SEEK IMMEDIATE MEDICAL CARE IF:   You have pain that radiates from your back into your legs.  You develop new bowel or bladder control problems.  You have unusual weakness or numbness in your arms or legs.  You develop nausea or vomiting.  You develop abdominal pain.  You feel faint. Document Released: 11/05/2005 Document Revised: 05/06/2012 Document Reviewed: 03/09/2014 [facility] Patient Information 2015 [place], [place]. This information  is not intended to replace advice given to you by your health care provider. Make sure you discuss any questions you have with your health care provider.

## 2015-03-05 NOTE — ED Provider Notes (Signed)
CSN: [id number]     Arrival date & time 03/05/15  1629 History   First MD Initiated Contact with Patient 03/05/15 1634     Chief Complaint  Patient presents with  . Back Pain      HPI Comments: Patient has a history of chronic low back pain and takes oxycodone TID on a regular basis.  At 5:30pm yesterday he developed sudden low back pain without injury.  He had similar pain October, 2014 that improved with a steroid injection.  He feels well otherwise.   He denies bowel or bladder dysfunction, and no saddle numbness.    Patient is a 69 y.o. male presenting with back pain. The history is provided by the patient.  Back Pain Location:  Lumbar spine Quality:  Aching Radiates to:  Does not radiate Pain severity:  Moderate Pain is:  Same all the time Onset quality:  Sudden Duration:  1 day Timing:  Constant Progression:  Unchanged Chronicity:  Recurrent Relieved by:  None tried Worsened by:  Movement Ineffective treatments:  Narcotics Associated symptoms: no abdominal pain, no bladder incontinence, no bowel incontinence, no dysuria, no fever, no leg pain, no numbness, no paresthesias, no pelvic pain, no perianal numbness, no tingling and no weakness     Past Medical History  Diagnosis Date  . Diabetes mellitus without complication   . Chronic back pain   . Insomnia    Past Surgical History  Procedure Laterality Date  . Hemorroidectomy     Family History  Problem Relation Age of Onset  . Adopted: Yes   History  Substance Use Topics  . Smoking status: Current Every Day Smoker -- 1.00 packs/day    Types: Cigarettes  . Smokeless tobacco: Never Used  . Alcohol Use: No    Review of Systems  Constitutional: Negative for fever.  Gastrointestinal: Negative for abdominal pain and bowel incontinence.  Genitourinary: Negative for bladder incontinence, dysuria and pelvic pain.  Musculoskeletal: Positive for back pain.  Neurological: Negative for tingling, weakness, numbness and  paresthesias.  All other systems reviewed and are negative.   Allergies  Codeine  Home Medications   Prior to Admission medications   Medication Sig Start Date End Date Taking? Authorizing Provider  cyclobenzaprine (FLEXERIL) 10 MG tablet One-Half tablet every 12 hours as needed for muscle relaxant. May take a whole tablet at bedtime.-- Caution: May cause drowsiness. 09/18/13   Jacqulyn Cane, MD  HYDROcodone-acetaminophen (NORCO/VICODIN) 5-325 MG per tablet Take 1-2 tablets by mouth every 6 (six) hours as needed for pain. Take with food. 09/18/13   Jacqulyn Cane, MD  ibuprofen (ADVIL,MOTRIN) 600 MG tablet Take 1 tablet (600 mg total) by mouth every 6 (six) hours as needed for pain (Take with food). 09/18/13   Jacqulyn Cane, MD  insulin aspart (NOVOLOG) 100 UNIT/ML injection Inject into the skin 3 (three) times daily with meals.    Historical Provider, MD  lidocaine (LIDODERM) 5 % Place 1 patch onto the skin daily. Remove & Discard patch within 12 hours or as directed by MD 03/05/15   Kandra Nicolas, MD  omeprazole (PRILOSEC) 40 MG capsule Take 40 mg by mouth daily.    Historical Provider, MD   BP 148/81 mmHg  Pulse 79  Temp(Src) 97.5 F (36.4 C) (Oral)  Resp 16  Ht 5' 9.5" (1.765 m)  Wt 182 lb (82.555 kg)  BMI 26.50 kg/m2  SpO2 97% Physical Exam  Constitutional: He is oriented to person, place, and time. He appears well-developed and  well-nourished. No distress.  HENT:  Head: Normocephalic.  Eyes: Pupils are equal, round, and reactive to light.  Neck: Neck supple.  Cardiovascular: Normal heart sounds.   Pulmonary/Chest: Breath sounds normal.  Abdominal: There is no tenderness.  Musculoskeletal:       Back:  Trigger point tenderness approximately L3-4 as noted on diagram. Achilles reflexes present with augmentation.  Back:  Good range of motion.  Can heel/toe walk and squat without difficulty.  Straight leg raising test is negative.  Sitting knee extension test is negative.   Strength and sensation in the lower extremities is normal.      Lymphadenopathy:    He has no cervical adenopathy.  Neurological: He is alert and oriented to person, place, and time.  Skin: Skin is warm and dry. No rash noted.  Nursing note and vitals reviewed.   ED Course  Procedures  none  MDM   1. Right-sided low back pain without sciatica    Kenalog 40mg  IM.  Begin Lidocaine patch. Apply ice pack for 20 to 30 minutes, 3 to 4 times daily  Continue until pain decreases.  Followup with Family Doctor if not improved in one week.     Kandra Nicolas, MD 03/12/15 [phone]

## 2015-03-14 DIAGNOSIS — M5416 Radiculopathy, lumbar region: Secondary | ICD-10-CM | POA: Diagnosis not present

## 2015-03-14 DIAGNOSIS — Z79891 Long term (current) use of opiate analgesic: Secondary | ICD-10-CM | POA: Diagnosis not present

## 2015-03-14 DIAGNOSIS — M47816 Spondylosis without myelopathy or radiculopathy, lumbar region: Secondary | ICD-10-CM | POA: Diagnosis not present

## 2015-03-14 DIAGNOSIS — M5136 Other intervertebral disc degeneration, lumbar region: Secondary | ICD-10-CM | POA: Diagnosis not present

## 2015-03-28 ENCOUNTER — Emergency Department: Admission: EM | Admit: 2015-03-28 | Discharge: 2015-03-28 | Discharge status: Absent without leave | Payer: Self-pay

## 2015-03-28 DIAGNOSIS — L820 Inflamed seborrheic keratosis: Secondary | ICD-10-CM | POA: Diagnosis not present

## 2015-03-28 DIAGNOSIS — D485 Neoplasm of uncertain behavior of skin: Secondary | ICD-10-CM | POA: Diagnosis not present

## 2015-05-05 DIAGNOSIS — R202 Paresthesia of skin: Secondary | ICD-10-CM | POA: Diagnosis not present

## 2015-06-13 DIAGNOSIS — M5416 Radiculopathy, lumbar region: Secondary | ICD-10-CM | POA: Diagnosis not present

## 2015-06-13 DIAGNOSIS — Z79891 Long term (current) use of opiate analgesic: Secondary | ICD-10-CM | POA: Diagnosis not present

## 2015-06-13 DIAGNOSIS — M47816 Spondylosis without myelopathy or radiculopathy, lumbar region: Secondary | ICD-10-CM | POA: Diagnosis not present

## 2015-06-13 DIAGNOSIS — M5136 Other intervertebral disc degeneration, lumbar region: Secondary | ICD-10-CM | POA: Diagnosis not present

## 2015-08-23 DIAGNOSIS — I10 Essential (primary) hypertension: Secondary | ICD-10-CM | POA: Diagnosis not present

## 2015-08-23 DIAGNOSIS — F419 Anxiety disorder, unspecified: Secondary | ICD-10-CM | POA: Diagnosis not present

## 2015-08-23 DIAGNOSIS — F5101 Primary insomnia: Secondary | ICD-10-CM | POA: Diagnosis not present

## 2015-08-23 DIAGNOSIS — R202 Paresthesia of skin: Secondary | ICD-10-CM | POA: Diagnosis not present

## 2015-08-23 DIAGNOSIS — E119 Type 2 diabetes mellitus without complications: Secondary | ICD-10-CM | POA: Diagnosis not present

## 2015-08-23 DIAGNOSIS — Z794 Long term (current) use of insulin: Secondary | ICD-10-CM | POA: Diagnosis not present

## 2015-08-29 DIAGNOSIS — M47816 Spondylosis without myelopathy or radiculopathy, lumbar region: Secondary | ICD-10-CM | POA: Diagnosis not present

## 2015-08-29 DIAGNOSIS — M5136 Other intervertebral disc degeneration, lumbar region: Secondary | ICD-10-CM | POA: Diagnosis not present

## 2015-08-29 DIAGNOSIS — Z79891 Long term (current) use of opiate analgesic: Secondary | ICD-10-CM | POA: Diagnosis not present

## 2015-08-29 DIAGNOSIS — M5416 Radiculopathy, lumbar region: Secondary | ICD-10-CM | POA: Diagnosis not present

## 2015-08-30 DIAGNOSIS — I10 Essential (primary) hypertension: Secondary | ICD-10-CM | POA: Diagnosis not present

## 2015-08-30 DIAGNOSIS — R110 Nausea: Secondary | ICD-10-CM | POA: Diagnosis not present

## 2015-08-30 DIAGNOSIS — Z794 Long term (current) use of insulin: Secondary | ICD-10-CM | POA: Diagnosis not present

## 2015-08-30 DIAGNOSIS — E119 Type 2 diabetes mellitus without complications: Secondary | ICD-10-CM | POA: Diagnosis not present

## 2015-08-30 DIAGNOSIS — F5101 Primary insomnia: Secondary | ICD-10-CM | POA: Diagnosis not present

## 2015-08-30 DIAGNOSIS — F419 Anxiety disorder, unspecified: Secondary | ICD-10-CM | POA: Diagnosis not present

## 2015-09-06 DIAGNOSIS — E531 Pyridoxine deficiency: Secondary | ICD-10-CM | POA: Diagnosis not present

## 2015-09-06 DIAGNOSIS — R202 Paresthesia of skin: Secondary | ICD-10-CM | POA: Diagnosis not present

## 2015-09-06 DIAGNOSIS — G603 Idiopathic progressive neuropathy: Secondary | ICD-10-CM | POA: Diagnosis not present

## 2015-09-06 DIAGNOSIS — M5417 Radiculopathy, lumbosacral region: Secondary | ICD-10-CM | POA: Diagnosis not present

## 2015-09-06 DIAGNOSIS — E559 Vitamin D deficiency, unspecified: Secondary | ICD-10-CM | POA: Diagnosis not present

## 2015-09-06 DIAGNOSIS — M5410 Radiculopathy, site unspecified: Secondary | ICD-10-CM | POA: Diagnosis not present

## 2015-09-06 DIAGNOSIS — G5603 Carpal tunnel syndrome, bilateral upper limbs: Secondary | ICD-10-CM | POA: Diagnosis not present

## 2015-09-06 DIAGNOSIS — E538 Deficiency of other specified B group vitamins: Secondary | ICD-10-CM | POA: Diagnosis not present

## 2015-09-06 DIAGNOSIS — R413 Other amnesia: Secondary | ICD-10-CM | POA: Diagnosis not present

## 2015-09-06 DIAGNOSIS — R201 Hypoesthesia of skin: Secondary | ICD-10-CM | POA: Diagnosis not present

## 2015-09-06 DIAGNOSIS — M5412 Radiculopathy, cervical region: Secondary | ICD-10-CM | POA: Diagnosis not present

## 2015-09-06 DIAGNOSIS — R634 Abnormal weight loss: Secondary | ICD-10-CM | POA: Diagnosis not present

## 2015-09-06 DIAGNOSIS — G609 Hereditary and idiopathic neuropathy, unspecified: Secondary | ICD-10-CM | POA: Diagnosis not present

## 2015-09-22 DIAGNOSIS — G5602 Carpal tunnel syndrome, left upper limb: Secondary | ICD-10-CM | POA: Diagnosis not present

## 2015-09-22 DIAGNOSIS — G603 Idiopathic progressive neuropathy: Secondary | ICD-10-CM | POA: Diagnosis not present

## 2015-09-22 DIAGNOSIS — G5601 Carpal tunnel syndrome, right upper limb: Secondary | ICD-10-CM | POA: Diagnosis not present

## 2015-09-22 DIAGNOSIS — R201 Hypoesthesia of skin: Secondary | ICD-10-CM | POA: Diagnosis not present

## 2015-09-22 DIAGNOSIS — M2550 Pain in unspecified joint: Secondary | ICD-10-CM | POA: Diagnosis not present

## 2015-09-22 DIAGNOSIS — R7989 Other specified abnormal findings of blood chemistry: Secondary | ICD-10-CM | POA: Diagnosis not present

## 2015-10-25 DIAGNOSIS — R200 Anesthesia of skin: Secondary | ICD-10-CM | POA: Diagnosis not present

## 2015-10-25 DIAGNOSIS — R202 Paresthesia of skin: Secondary | ICD-10-CM | POA: Diagnosis not present

## 2015-10-31 DIAGNOSIS — M5136 Other intervertebral disc degeneration, lumbar region: Secondary | ICD-10-CM | POA: Diagnosis not present

## 2015-10-31 DIAGNOSIS — M47816 Spondylosis without myelopathy or radiculopathy, lumbar region: Secondary | ICD-10-CM | POA: Diagnosis not present

## 2015-10-31 DIAGNOSIS — Z79891 Long term (current) use of opiate analgesic: Secondary | ICD-10-CM | POA: Diagnosis not present

## 2015-10-31 DIAGNOSIS — M5416 Radiculopathy, lumbar region: Secondary | ICD-10-CM | POA: Diagnosis not present

## 2015-11-02 DIAGNOSIS — M5416 Radiculopathy, lumbar region: Secondary | ICD-10-CM | POA: Diagnosis not present

## 2015-11-02 DIAGNOSIS — E0842 Diabetes mellitus due to underlying condition with diabetic polyneuropathy: Secondary | ICD-10-CM | POA: Diagnosis not present

## 2015-11-10 DIAGNOSIS — Z79891 Long term (current) use of opiate analgesic: Secondary | ICD-10-CM | POA: Diagnosis not present

## 2015-11-28 DIAGNOSIS — R200 Anesthesia of skin: Secondary | ICD-10-CM | POA: Diagnosis not present

## 2015-11-28 DIAGNOSIS — M5416 Radiculopathy, lumbar region: Secondary | ICD-10-CM | POA: Diagnosis not present

## 2015-11-28 DIAGNOSIS — E0842 Diabetes mellitus due to underlying condition with diabetic polyneuropathy: Secondary | ICD-10-CM | POA: Diagnosis not present

## 2015-11-28 DIAGNOSIS — R202 Paresthesia of skin: Secondary | ICD-10-CM | POA: Diagnosis not present

## 2016-01-10 DIAGNOSIS — R202 Paresthesia of skin: Secondary | ICD-10-CM | POA: Diagnosis not present

## 2016-01-10 DIAGNOSIS — I499 Cardiac arrhythmia, unspecified: Secondary | ICD-10-CM | POA: Diagnosis not present

## 2016-01-10 DIAGNOSIS — F419 Anxiety disorder, unspecified: Secondary | ICD-10-CM | POA: Diagnosis not present

## 2016-01-10 DIAGNOSIS — G250 Essential tremor: Secondary | ICD-10-CM | POA: Diagnosis not present

## 2016-01-10 DIAGNOSIS — I10 Essential (primary) hypertension: Secondary | ICD-10-CM | POA: Diagnosis not present

## 2016-01-10 DIAGNOSIS — E119 Type 2 diabetes mellitus without complications: Secondary | ICD-10-CM | POA: Diagnosis not present

## 2016-01-10 DIAGNOSIS — F17210 Nicotine dependence, cigarettes, uncomplicated: Secondary | ICD-10-CM | POA: Diagnosis not present

## 2016-01-10 DIAGNOSIS — Z794 Long term (current) use of insulin: Secondary | ICD-10-CM | POA: Diagnosis not present

## 2016-01-23 DIAGNOSIS — M5416 Radiculopathy, lumbar region: Secondary | ICD-10-CM | POA: Diagnosis not present

## 2016-01-23 DIAGNOSIS — M5136 Other intervertebral disc degeneration, lumbar region: Secondary | ICD-10-CM | POA: Diagnosis not present

## 2016-01-23 DIAGNOSIS — M47816 Spondylosis without myelopathy or radiculopathy, lumbar region: Secondary | ICD-10-CM | POA: Diagnosis not present

## 2016-01-23 DIAGNOSIS — Z79891 Long term (current) use of opiate analgesic: Secondary | ICD-10-CM | POA: Diagnosis not present

## 2016-01-27 DIAGNOSIS — E0840 Diabetes mellitus due to underlying condition with diabetic neuropathy, unspecified: Secondary | ICD-10-CM | POA: Diagnosis not present

## 2016-01-27 DIAGNOSIS — Z794 Long term (current) use of insulin: Secondary | ICD-10-CM | POA: Diagnosis not present

## 2016-01-27 DIAGNOSIS — G629 Polyneuropathy, unspecified: Secondary | ICD-10-CM | POA: Diagnosis not present

## 2016-01-27 DIAGNOSIS — R5383 Other fatigue: Secondary | ICD-10-CM | POA: Diagnosis not present

## 2016-02-08 DIAGNOSIS — R899 Unspecified abnormal finding in specimens from other organs, systems and tissues: Secondary | ICD-10-CM | POA: Diagnosis not present

## 2016-02-08 DIAGNOSIS — E1140 Type 2 diabetes mellitus with diabetic neuropathy, unspecified: Secondary | ICD-10-CM | POA: Diagnosis not present

## 2016-02-08 DIAGNOSIS — Z885 Allergy status to narcotic agent status: Secondary | ICD-10-CM | POA: Diagnosis not present

## 2016-02-08 DIAGNOSIS — Z8719 Personal history of other diseases of the digestive system: Secondary | ICD-10-CM | POA: Diagnosis not present

## 2016-02-08 DIAGNOSIS — Z794 Long term (current) use of insulin: Secondary | ICD-10-CM | POA: Diagnosis not present

## 2016-02-10 DIAGNOSIS — F5101 Primary insomnia: Secondary | ICD-10-CM | POA: Diagnosis not present

## 2016-02-10 DIAGNOSIS — I10 Essential (primary) hypertension: Secondary | ICD-10-CM | POA: Diagnosis not present

## 2016-02-10 DIAGNOSIS — Z794 Long term (current) use of insulin: Secondary | ICD-10-CM | POA: Diagnosis not present

## 2016-02-10 DIAGNOSIS — E119 Type 2 diabetes mellitus without complications: Secondary | ICD-10-CM | POA: Diagnosis not present

## 2016-02-10 DIAGNOSIS — R202 Paresthesia of skin: Secondary | ICD-10-CM | POA: Diagnosis not present

## 2016-02-10 DIAGNOSIS — F17210 Nicotine dependence, cigarettes, uncomplicated: Secondary | ICD-10-CM | POA: Diagnosis not present

## 2016-02-22 DIAGNOSIS — R799 Abnormal finding of blood chemistry, unspecified: Secondary | ICD-10-CM | POA: Diagnosis not present

## 2016-03-21 DIAGNOSIS — E0842 Diabetes mellitus due to underlying condition with diabetic polyneuropathy: Secondary | ICD-10-CM | POA: Diagnosis not present

## 2016-03-21 DIAGNOSIS — R200 Anesthesia of skin: Secondary | ICD-10-CM | POA: Diagnosis not present

## 2016-03-21 DIAGNOSIS — M5384 Other specified dorsopathies, thoracic region: Secondary | ICD-10-CM | POA: Diagnosis not present

## 2016-03-21 DIAGNOSIS — M5416 Radiculopathy, lumbar region: Secondary | ICD-10-CM | POA: Diagnosis not present

## 2016-03-26 DIAGNOSIS — M5416 Radiculopathy, lumbar region: Secondary | ICD-10-CM | POA: Diagnosis not present

## 2016-03-26 DIAGNOSIS — Z79891 Long term (current) use of opiate analgesic: Secondary | ICD-10-CM | POA: Diagnosis not present

## 2016-03-26 DIAGNOSIS — M5136 Other intervertebral disc degeneration, lumbar region: Secondary | ICD-10-CM | POA: Diagnosis not present

## 2016-03-26 DIAGNOSIS — M47816 Spondylosis without myelopathy or radiculopathy, lumbar region: Secondary | ICD-10-CM | POA: Diagnosis not present

## 2016-03-30 DIAGNOSIS — M791 Myalgia: Secondary | ICD-10-CM | POA: Diagnosis not present

## 2016-05-18 DIAGNOSIS — I10 Essential (primary) hypertension: Secondary | ICD-10-CM | POA: Diagnosis not present

## 2016-05-18 DIAGNOSIS — F419 Anxiety disorder, unspecified: Secondary | ICD-10-CM | POA: Diagnosis not present

## 2016-05-21 DIAGNOSIS — M5416 Radiculopathy, lumbar region: Secondary | ICD-10-CM | POA: Diagnosis not present

## 2016-05-21 DIAGNOSIS — M5136 Other intervertebral disc degeneration, lumbar region: Secondary | ICD-10-CM | POA: Diagnosis not present

## 2016-05-21 DIAGNOSIS — Z79891 Long term (current) use of opiate analgesic: Secondary | ICD-10-CM | POA: Diagnosis not present

## 2016-05-21 DIAGNOSIS — M47816 Spondylosis without myelopathy or radiculopathy, lumbar region: Secondary | ICD-10-CM | POA: Diagnosis not present

## 2016-05-28 DIAGNOSIS — R200 Anesthesia of skin: Secondary | ICD-10-CM | POA: Diagnosis not present

## 2016-05-28 DIAGNOSIS — M546 Pain in thoracic spine: Secondary | ICD-10-CM | POA: Diagnosis not present

## 2016-05-28 DIAGNOSIS — R202 Paresthesia of skin: Secondary | ICD-10-CM | POA: Diagnosis not present

## 2016-05-28 DIAGNOSIS — N529 Male erectile dysfunction, unspecified: Secondary | ICD-10-CM | POA: Diagnosis not present

## 2016-05-30 DIAGNOSIS — F17210 Nicotine dependence, cigarettes, uncomplicated: Secondary | ICD-10-CM | POA: Diagnosis not present

## 2016-05-30 DIAGNOSIS — I10 Essential (primary) hypertension: Secondary | ICD-10-CM | POA: Diagnosis not present

## 2016-05-30 DIAGNOSIS — F419 Anxiety disorder, unspecified: Secondary | ICD-10-CM | POA: Diagnosis not present

## 2016-05-30 DIAGNOSIS — R202 Paresthesia of skin: Secondary | ICD-10-CM | POA: Diagnosis not present

## 2016-05-30 DIAGNOSIS — F5101 Primary insomnia: Secondary | ICD-10-CM | POA: Diagnosis not present

## 2016-05-30 DIAGNOSIS — R350 Frequency of micturition: Secondary | ICD-10-CM | POA: Diagnosis not present

## 2016-05-30 DIAGNOSIS — Z794 Long term (current) use of insulin: Secondary | ICD-10-CM | POA: Diagnosis not present

## 2016-05-30 DIAGNOSIS — E119 Type 2 diabetes mellitus without complications: Secondary | ICD-10-CM | POA: Diagnosis not present

## 2016-06-06 DIAGNOSIS — M549 Dorsalgia, unspecified: Secondary | ICD-10-CM | POA: Diagnosis not present

## 2016-06-06 DIAGNOSIS — Z888 Allergy status to other drugs, medicaments and biological substances status: Secondary | ICD-10-CM | POA: Diagnosis not present

## 2016-06-06 DIAGNOSIS — F17210 Nicotine dependence, cigarettes, uncomplicated: Secondary | ICD-10-CM | POA: Diagnosis not present

## 2016-06-06 DIAGNOSIS — R202 Paresthesia of skin: Secondary | ICD-10-CM | POA: Diagnosis not present

## 2016-06-06 DIAGNOSIS — N529 Male erectile dysfunction, unspecified: Secondary | ICD-10-CM | POA: Diagnosis not present

## 2016-06-06 DIAGNOSIS — F419 Anxiety disorder, unspecified: Secondary | ICD-10-CM | POA: Diagnosis not present

## 2016-06-06 DIAGNOSIS — Z79899 Other long term (current) drug therapy: Secondary | ICD-10-CM | POA: Diagnosis not present

## 2016-06-06 DIAGNOSIS — M546 Pain in thoracic spine: Secondary | ICD-10-CM | POA: Diagnosis not present

## 2016-06-06 DIAGNOSIS — I77810 Thoracic aortic ectasia: Secondary | ICD-10-CM | POA: Diagnosis not present

## 2016-06-06 DIAGNOSIS — M5021 Other cervical disc displacement,  high cervical region: Secondary | ICD-10-CM | POA: Diagnosis not present

## 2016-06-06 DIAGNOSIS — I10 Essential (primary) hypertension: Secondary | ICD-10-CM | POA: Diagnosis not present

## 2016-06-06 DIAGNOSIS — R200 Anesthesia of skin: Secondary | ICD-10-CM | POA: Diagnosis not present

## 2016-06-25 DIAGNOSIS — M546 Pain in thoracic spine: Secondary | ICD-10-CM | POA: Diagnosis not present

## 2016-06-25 DIAGNOSIS — M791 Myalgia: Secondary | ICD-10-CM | POA: Diagnosis not present

## 2016-06-25 DIAGNOSIS — M542 Cervicalgia: Secondary | ICD-10-CM | POA: Diagnosis not present

## 2016-06-25 DIAGNOSIS — E0842 Diabetes mellitus due to underlying condition with diabetic polyneuropathy: Secondary | ICD-10-CM | POA: Diagnosis not present

## 2016-07-16 DIAGNOSIS — M47816 Spondylosis without myelopathy or radiculopathy, lumbar region: Secondary | ICD-10-CM | POA: Diagnosis not present

## 2016-07-16 DIAGNOSIS — M5136 Other intervertebral disc degeneration, lumbar region: Secondary | ICD-10-CM | POA: Diagnosis not present

## 2016-07-16 DIAGNOSIS — M5416 Radiculopathy, lumbar region: Secondary | ICD-10-CM | POA: Diagnosis not present

## 2016-07-16 DIAGNOSIS — Z79899 Other long term (current) drug therapy: Secondary | ICD-10-CM | POA: Diagnosis not present

## 2016-07-25 DIAGNOSIS — D3132 Benign neoplasm of left choroid: Secondary | ICD-10-CM | POA: Diagnosis not present

## 2016-07-25 DIAGNOSIS — E119 Type 2 diabetes mellitus without complications: Secondary | ICD-10-CM | POA: Diagnosis not present

## 2016-07-25 DIAGNOSIS — H2513 Age-related nuclear cataract, bilateral: Secondary | ICD-10-CM | POA: Diagnosis not present

## 2016-08-02 DIAGNOSIS — G894 Chronic pain syndrome: Secondary | ICD-10-CM | POA: Diagnosis not present

## 2016-08-02 DIAGNOSIS — Z6825 Body mass index (BMI) 25.0-25.9, adult: Secondary | ICD-10-CM | POA: Diagnosis not present

## 2016-08-02 DIAGNOSIS — I10 Essential (primary) hypertension: Secondary | ICD-10-CM | POA: Diagnosis not present

## 2016-09-13 DIAGNOSIS — Z79891 Long term (current) use of opiate analgesic: Secondary | ICD-10-CM | POA: Diagnosis not present

## 2016-09-13 DIAGNOSIS — M5136 Other intervertebral disc degeneration, lumbar region: Secondary | ICD-10-CM | POA: Diagnosis not present

## 2016-09-13 DIAGNOSIS — M5416 Radiculopathy, lumbar region: Secondary | ICD-10-CM | POA: Diagnosis not present

## 2016-09-13 DIAGNOSIS — M47816 Spondylosis without myelopathy or radiculopathy, lumbar region: Secondary | ICD-10-CM | POA: Diagnosis not present

## 2016-09-14 ENCOUNTER — Ambulatory Visit: Payer: Medicare Other | Admitting: Neurology

## 2016-09-14 ENCOUNTER — Telehealth: Payer: Self-pay | Admitting: Neurology

## 2016-09-14 NOTE — Telephone Encounter (Signed)
This patient did not show for a new patient appointment today. The patient got lost.

## 2016-09-18 ENCOUNTER — Encounter: Payer: Self-pay | Admitting: Neurology

## 2016-09-18 ENCOUNTER — Ambulatory Visit (INDEPENDENT_AMBULATORY_CARE_PROVIDER_SITE_OTHER): Payer: Medicare Other | Admitting: Neurology

## 2016-09-18 VITALS — BP 185/88 | HR 52 | Ht 69.5 in | Wt 181.8 lb

## 2016-09-18 DIAGNOSIS — R202 Paresthesia of skin: Secondary | ICD-10-CM | POA: Diagnosis not present

## 2016-09-18 DIAGNOSIS — G629 Polyneuropathy, unspecified: Secondary | ICD-10-CM

## 2016-09-18 DIAGNOSIS — M6289 Other specified disorders of muscle: Secondary | ICD-10-CM | POA: Diagnosis not present

## 2016-09-18 DIAGNOSIS — E538 Deficiency of other specified B group vitamins: Secondary | ICD-10-CM

## 2016-09-18 HISTORY — DX: Polyneuropathy, unspecified: G62.9

## 2016-09-18 NOTE — Progress Notes (Signed)
Reason for visit: Paresthesia  Referring physician: Dr. Misty Stanley Brewer is a 70 y.o. male  History of present illness:  Mark Brewer is a 70 year old right-handed white male with a history of onset of symptoms that began in November 2014. The patient indicates that he was putting together a baby swing and strained his arms doing this. The patient began having some spasm in the low back around that time, within the next 24-48 hours he developed a left hemisensory deficit. The patient began developing similar symptoms on the right side within the next 2 weeks. The onset of the right-sided symptoms was sudden, overnight. The patient has had persistence of sensory symptoms that he claims is gradually worsening over time. The patient reports no overt pain, but he does have a squeezing sensation in the midsection, he has been unable to play the piano because of a loss of sensation. He also reports a sensation of muscle stiffness that keeps him from performing other activities such as playing golf. The patient denies any balance issues, he denies falls. He has not had trouble controlling the bowels or the bladder. He denies any headache, dizziness, visual changes, or with speech or swallowing. He reports no memory changes. He has undergone an extensive workup that has been performed through several different neurologists including Dr. Elvia Collum at [facility], and Dr. Cherly Beach through [facility]. The patient has undergone MRI evaluation of the brain, cervical spine, thoracic spine, and lumbar spine. The patient has some degenerative disc disease at the C5-6 and C6-7 level with some spinal stenosis without cord compression. No other abnormalities are noted within the central nervous system. The patient has had EMG and nerve conduction study evaluation, one study suggested bilateral acute and chronic L5 and S1 radiculopathies, but MRI of the lumbar spine did not show nerve root impingement. 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 Lumbar puncture evaluation was unrevealing. The patient apparently has had a visual evoked response test that he claims was normal. The patient was given a trial on Lyrica, but he did not tolerate the medication and did not get benefit from it. He has been seen through psychiatry for a possible conversion disorder. The patient has been referred to this office for another neurology opinion regarding his symptoms.  Past Medical History:  Diagnosis Date  . Anxiety   . Chronic back pain   . Depression   . Diabetes mellitus without complication ([place])   . Insomnia     Past Surgical History:  Procedure Laterality Date  . HEMORROIDECTOMY      Family History  Problem Relation Age of Onset  . Adopted: Yes    Social history:  reports that he has been smoking Cigarettes.  He has been smoking about 1.00 pack per day. He has never used smokeless tobacco. He reports that he does not drink alcohol or use drugs.  Medications:  Prior to Admission medications   Medication Sig Start Date End Date Taking? Authorizing Provider  baclofen (LIORESAL) 10 MG tablet  08/13/16  Yes Historical Provider, MD  busPIRone (BUSPAR) 15 MG tablet TAKE 1/2 TABLET TWICE DAILY 07/20/16  Yes Historical Provider, MD  insulin aspart (NOVOLOG) 100 UNIT/ML injection Inject 20-40 Units into the skin 3 (three) times daily with meals.    Yes Historical Provider, MD  omeprazole (PRILOSEC) 40 MG capsule Take 40 mg by mouth daily.   Yes Historical Provider, MD  oxyCODONE (  ROXICODONE) 15 MG immediate release tablet Take by mouth. 03/27/16  Yes Historical Provider, MD  propranolol (INDERAL) 40 MG tablet Take by mouth. 05/18/16  Yes Historical Provider, MD  zolpidem (AMBIEN) 10 MG tablet Take one tablet (10 mg total) by mouth at bedtime as needed for Sleep. Take one tablet by mouth at bedtime as needed for sleep. 08/24/16  Yes Historical Provider, MD        Allergies  Allergen Reactions  . Beta Adrenergic Blockers     Other reaction(s): Other (See Comments) hypoglycemia  . Pregabalin     Other reaction(s): Other Weight gain   . Ace Inhibitors     Other reaction(s): Unknown Weakness  . Codeine     REACTION: GI upset    ROS:  Out of a complete 14 system review of symptoms, the patient complains only of the following symptoms, and all other reviewed systems are negative.  Depression, anxiety, suicidal thoughts Impotence Numbness, muscle tightness  Blood pressure (!) 185/88, pulse (!) 52, height 5' 9.5" (1.765 m), weight 181 lb 12 oz (82.4 kg).  Physical Exam  General: The patient is alert and cooperative at the time of the examination.  Eyes: Pupils are equal, round, and reactive to light. Discs are flat bilaterally.  Neck: The neck is supple, no carotid bruits are noted.  Respiratory: The respiratory examination is clear.  Cardiovascular: The cardiovascular examination reveals a regular rate and rhythm, no obvious murmurs or rubs are noted.  Skin: Extremities are without significant edema.  Neurologic Exam  Mental status: The patient is alert and oriented x 3 at the time of the examination. The patient has apparent normal recent and remote memory, with an apparently normal attention span and concentration ability.  Cranial nerves: Facial symmetry is present. There is good sensation of the face to pinprick and soft touch bilaterally. The strength of the facial muscles and the muscles to head turning and shoulder shrug are normal bilaterally. Speech is well enunciated, no aphasia or dysarthria is noted. Extraocular movements are full. Visual fields are full. The tongue is midline, and the patient has symmetric elevation of the soft palate. No obvious hearing deficits are noted.  Motor: The motor testing reveals 5 over 5 strength of all 4 extremities. Good symmetric motor tone is noted throughout.  Sensory: Sensory  testing is intact to pinprick, soft touch, vibration sensation, and position sense on all 4 extremities. No evidence of extinction is noted.  Coordination: Cerebellar testing reveals good finger-nose-finger and heel-to-shin bilaterally.  Gait and station: Gait is normal. Tandem gait is normal. Romberg is negative. No drift is seen.  Reflexes: Deep tendon reflexes are symmetric and normal bilaterally. Toes are downgoing bilaterally.   Assessment/Plan:  1. Subjective sensory alteration, muscle tightness  The patient has had extensive neurologic evaluation in the past. No etiology of his subjective symptoms are noted. The clinical examination today objectively is normal. We will pursue blood work today. His main complaint is a sensation of muscle stiffness. The patient may be given a trial on tizanidine or diazepam in the future. He has been given a trial on baclofen but this did not offer any benefit. It is quite possible that the onset of the symptoms are of nonorganic origin.  Mark Alexanders MD 09/18/2016 11:51 AM  [facility] [address] [place], [place] [address]  Phone [phone] Fax [phone]

## 2016-09-20 ENCOUNTER — Encounter: Payer: Self-pay | Admitting: Neurology

## 2016-09-29 LAB — SPECIMEN STATUS REPORT

## 2016-10-01 ENCOUNTER — Telehealth: Payer: Self-pay

## 2016-10-01 ENCOUNTER — Telehealth: Payer: Self-pay | Admitting: Neurology

## 2016-10-01 MED ORDER — DIAZEPAM 2 MG PO TABS: 2.0000 mg | ORAL_TABLET | Freq: Two times a day (BID) | ORAL | 2 refills | Status: DC

## 2016-10-01 NOTE — Telephone Encounter (Signed)
I called the patient. The patient is still having symptoms of muscle stiffness, we look for stiff person syndrome, the antibodies were negative, but I will still consider a trial on Valium in low dose. The patient is not on baclofen currently.

## 2016-10-01 NOTE — Telephone Encounter (Signed)
Spoke to pt earlier today w/ lab results. See phone note.

## 2016-10-01 NOTE — Telephone Encounter (Signed)
-----   Message from Kathrynn Ducking, MD sent at 09/29/2016  3:46 PM EST -----  The blood work results are unremarkable. Please call the patient.  ----- Message ----- From: Lavone Neri Lab Results In Sent: 09/19/2016   7:42 AM To: Kathrynn Ducking, MD

## 2016-10-01 NOTE — Telephone Encounter (Signed)
Called pt w/ unremarkable lab results. Verbalized understanding and appreciation for call.   Reports that he continues to have ongoing symptoms of numbness and pain. Would like to discuss "the next step." Says that muscle relaxers have not been helpful in the past and is unable to take other meds along w/ his pain medication. He has not yet been scheduled for follow-up.

## 2016-10-01 NOTE — Addendum Note (Signed)
Addended by: Margette Fast on: 10/01/2016 06:15 PM   Modules accepted: Orders

## 2016-10-01 NOTE — Telephone Encounter (Signed)
Patient called to request lab results.

## 2016-10-02 NOTE — Telephone Encounter (Signed)
Rx printed, signed, faxed to pharmacy. Called to notify pt and to schedule 4 month follow-up appt. Pt again mentioned his symptoms possibly being related to vaccines that he received a yr or 2 ago. Says that he also had a tick bite within 6 months of symptoms. Let him know that diazepam was prescribed to help with management of his symptoms. Agreed to follow-up in March.

## 2016-10-04 LAB — RHEUMATOID FACTOR: Rhuematoid fact SerPl-aCnc: <10 IU/mL (ref 0.0–13.9)

## 2016-10-04 LAB — RPR: RPR Ser Ql: NONREACTIVE

## 2016-10-04 LAB — HIV ANTIBODY (ROUTINE TESTING W REFLEX): HIV SCREEN 4TH GENERATION: NONREACTIVE

## 2016-10-04 LAB — GLUTAMIC ACID DECARBOXYLASE AUTO ABS: Glutamic Acid Decarb Ab: <5 U/mL (ref 0.0–5.0)

## 2016-10-04 LAB — MULTIPLE MYELOMA PANEL, SERUM
ALBUMIN SERPL ELPH-MCNC: 3.7 g/dL (ref 2.9–4.4)
Albumin/Glob SerPl: 1.5 (ref 0.7–1.7)
Alpha 1: 0.3 g/dL (ref 0.0–0.4)
Alpha2 Glob SerPl Elph-Mcnc: 0.8 g/dL (ref 0.4–1.0)
B-Globulin SerPl Elph-Mcnc: 0.9 g/dL (ref 0.7–1.3)
Gamma Glob SerPl Elph-Mcnc: 0.5 g/dL (ref 0.4–1.8)
Globulin, Total: 2.5 g/dL (ref 2.2–3.9)
IGA/IMMUNOGLOBULIN A, SERUM: 73 mg/dL (ref 61–437)
IGM (IMMUNOGLOBULIN M), SRM: 34 mg/dL (ref 20–172)
IgG (Immunoglobin G), Serum: 565 mg/dL — ABNORMAL LOW (ref 700–1600)
Total Protein: 6.2 g/dL (ref 6.0–8.5)

## 2016-10-04 LAB — ANGIOTENSIN CONVERTING ENZYME: Angio Convert Enzyme: 53 U/L (ref 14–82)

## 2016-10-04 LAB — PARANEOPLASTIC PROFILE 1

## 2016-10-04 LAB — VITAMIN B12

## 2016-10-04 LAB — HTLV I+II ANTIBODIES, (EIA), BLD: HTLV I/II AB: NEGATIVE

## 2016-10-04 LAB — SEDIMENTATION RATE: Sed Rate: 2 mm/hr (ref 0–30)

## 2016-10-04 LAB — ANA W/REFLEX: Anti Nuclear Antibody(ANA): NEGATIVE

## 2016-10-04 LAB — COPPER, SERUM: Copper: 116 ug/dL (ref 72–166)

## 2016-10-14 DIAGNOSIS — F17210 Nicotine dependence, cigarettes, uncomplicated: Secondary | ICD-10-CM | POA: Diagnosis not present

## 2016-10-14 DIAGNOSIS — Z794 Long term (current) use of insulin: Secondary | ICD-10-CM | POA: Diagnosis not present

## 2016-10-14 DIAGNOSIS — R220 Localized swelling, mass and lump, head: Secondary | ICD-10-CM | POA: Diagnosis not present

## 2016-10-14 DIAGNOSIS — F419 Anxiety disorder, unspecified: Secondary | ICD-10-CM | POA: Diagnosis not present

## 2016-10-14 DIAGNOSIS — E119 Type 2 diabetes mellitus without complications: Secondary | ICD-10-CM | POA: Diagnosis not present

## 2016-10-14 DIAGNOSIS — Z888 Allergy status to other drugs, medicaments and biological substances status: Secondary | ICD-10-CM | POA: Diagnosis not present

## 2016-10-14 DIAGNOSIS — Z85828 Personal history of other malignant neoplasm of skin: Secondary | ICD-10-CM | POA: Diagnosis not present

## 2016-10-14 DIAGNOSIS — K029 Dental caries, unspecified: Secondary | ICD-10-CM | POA: Diagnosis not present

## 2016-10-14 DIAGNOSIS — Z79899 Other long term (current) drug therapy: Secondary | ICD-10-CM | POA: Diagnosis not present

## 2016-10-14 DIAGNOSIS — K047 Periapical abscess without sinus: Secondary | ICD-10-CM | POA: Diagnosis not present

## 2016-10-14 DIAGNOSIS — Z885 Allergy status to narcotic agent status: Secondary | ICD-10-CM | POA: Diagnosis not present

## 2016-10-31 DIAGNOSIS — M47816 Spondylosis without myelopathy or radiculopathy, lumbar region: Secondary | ICD-10-CM | POA: Diagnosis not present

## 2016-10-31 DIAGNOSIS — M5136 Other intervertebral disc degeneration, lumbar region: Secondary | ICD-10-CM | POA: Diagnosis not present

## 2016-10-31 DIAGNOSIS — M5416 Radiculopathy, lumbar region: Secondary | ICD-10-CM | POA: Diagnosis not present

## 2016-10-31 DIAGNOSIS — Z79891 Long term (current) use of opiate analgesic: Secondary | ICD-10-CM | POA: Diagnosis not present

## 2016-12-31 DIAGNOSIS — M47816 Spondylosis without myelopathy or radiculopathy, lumbar region: Secondary | ICD-10-CM | POA: Diagnosis not present

## 2016-12-31 DIAGNOSIS — M5416 Radiculopathy, lumbar region: Secondary | ICD-10-CM | POA: Diagnosis not present

## 2016-12-31 DIAGNOSIS — Z79891 Long term (current) use of opiate analgesic: Secondary | ICD-10-CM | POA: Diagnosis not present

## 2016-12-31 DIAGNOSIS — M5136 Other intervertebral disc degeneration, lumbar region: Secondary | ICD-10-CM | POA: Diagnosis not present

## 2017-01-08 ENCOUNTER — Ambulatory Visit (INDEPENDENT_AMBULATORY_CARE_PROVIDER_SITE_OTHER): Payer: Medicare Other | Admitting: Physician Assistant

## 2017-01-08 VITALS — BP 172/92 | HR 69 | Wt 171.0 lb

## 2017-01-08 DIAGNOSIS — Z794 Long term (current) use of insulin: Secondary | ICD-10-CM

## 2017-01-08 DIAGNOSIS — M791 Myalgia: Secondary | ICD-10-CM

## 2017-01-08 DIAGNOSIS — M7918 Myalgia, other site: Secondary | ICD-10-CM

## 2017-01-08 DIAGNOSIS — E1142 Type 2 diabetes mellitus with diabetic polyneuropathy: Secondary | ICD-10-CM | POA: Diagnosis not present

## 2017-01-08 DIAGNOSIS — F331 Major depressive disorder, recurrent, moderate: Secondary | ICD-10-CM | POA: Diagnosis not present

## 2017-01-08 MED ORDER — DULOXETINE HCL 30 MG PO CPEP: 30.0000 mg | ORAL_CAPSULE | Freq: Every day | ORAL | 0 refills | Status: DC

## 2017-01-08 NOTE — Progress Notes (Signed)
HPI:                                                                Mark Brewer is a 71 y.o. male who presents to [facility]: Primary Care Sports Medicine today to establish care  Current Concerns include "looking for a diagnosis on a 83-month whole body tightness, stiffness, squeezing issue"  Patient with approximately 3-year history of "unexplained" musculoskeletal pain and paresthesias involving his neck, back, flanks, hips and knees. He has been worked up by Print production planner, mostly neurologists, at [place], [place], [facility], and [facility]. Patient comes to me today to "start fresh" with the work-up and is hoping to find a diagnosis to explain his symptoms.  He describes "tightness, stiffness, and squeezing" of his muscles. This is persistent and has caused him a great deal of psychological distress. He denies headaches, dizziness, lightheadedness or syncope. At the original onset of his symptoms he experienced some unilateral weakness that sounded like a TIA/stroke, but the work-up was unrevealing and there is no focal weakness anymore.  He states that he is not interested in preventive care because he doesn't have the capacity to deal with those things at this time. He also feels so depressed that he doesn't really "care if smoking kills him."  Chronic pain: followed by Dr. Blenda Nicely, Neurology. Taking Oxycodone 15mg , 5 times daily and Baclofen tid.   Anxiety/Depression: taking Lexapro 10mg  daily, Buspar 7.5 bid and Ambien 10mg  nightly  HTN: taking Propranolol 40mg  daily. He has a history of anxiety and palpitations. Per his prior PCP, he did not tolerate other antihypertensives. He has declined referrals to cardiology. Cardiac risk factors include diabetes and heavy tobacco use.  DM: taking NPH and regular insulin on a sliding scale. Per outside records, his A1c's are around 8. He has regular eye exams. He is not on an ACE due to intolerance.  Health Maintenance Health  Maintenance  Topic Date Due  . HEMOGLOBIN A1C  1946/08/13  . Hepatitis C Screening  November 14, 1946  . FOOT EXAM  07/13/1958  . OPHTHALMOLOGY EXAM  07/13/1958  . URINE MICROALBUMIN  07/13/1958  . TETANUS/TDAP  07/14/1967  . COLONOSCOPY  07/13/1998  . PNA vac Low Risk Adult (2 of 2 - PPSV23) 09/18/2014  . INFLUENZA VACCINE  06/19/2016    Past Medical History:  Diagnosis Date  . Anxiety   . Chronic back pain   . Depression   . Diabetes mellitus without complication ([place])   . Insomnia   . Small fiber neuropathy ([place]) 09/18/2016   Past Surgical History:  Procedure Laterality Date  . HEMORROIDECTOMY     Social History  Substance Use Topics  . Smoking status: Current Every Day Smoker    Packs/day: 1.00    Types: Cigarettes  . Smokeless tobacco: Never Used  . Alcohol use No   family history is not on file. He was adopted.  ROS: negative except as noted in the HPI  Medications: Current Outpatient Prescriptions  Medication Sig Dispense Refill  . busPIRone (BUSPAR) 15 MG tablet TAKE 1/2 TABLET TWICE DAILY    . insulin aspart (NOVOLOG) 100 UNIT/ML injection Inject 20-40 Units into the skin 3 (three) times daily with meals.     Marland Kitchen omeprazole (PRILOSEC) 40 MG capsule Take  40 mg by mouth daily.    Marland Kitchen oxyCODONE (ROXICODONE) 15 MG immediate release tablet Take by mouth.    . propranolol (INDERAL) 40 MG tablet Take by mouth.    . zolpidem (AMBIEN) 10 MG tablet Take one tablet (10 mg total) by mouth at bedtime as needed for Sleep. Take one tablet by mouth at bedtime as needed for sleep.     No current facility-administered medications for this visit.    Allergies  Allergen Reactions  . Beta Adrenergic Blockers     Other reaction(s): Other (See Comments) hypoglycemia  . Pregabalin     Other reaction(s): Other Weight gain   . Ace Inhibitors     Other reaction(s): Unknown Weakness  . Codeine     REACTION: GI upset       Objective:  BP (!) 172/92   Pulse 69   Wt 171 lb (77.6  kg)   BMI 24.89 kg/m  Gen: well-groomed, cooperative, not ill-appearing, no distress Pulm: Normal work of breathing, speaking in full sentences Neuro: alert and oriented x 3, EOM's intact MSK: normal gait and station, no peripheral edema Psych: normal affect, depressed mood, normal speech and thought content  I personally reviewed outside imaging, including MRI thoracic spine and nerve conduction studies.  Assessment and Plan: 71 y.o. male with   1. Musculoskeletal pain - likely multifactorial given patient's imaging and nerve conduction studies have shown both degenerative disc disease, radiculopathy and sensorimotor polyneuropathy. Given that there is no real distribution of patient's pain and paresthesias I think he would benefit from being treated similarly to fibromyalgia. In addition, given comorbid depression from his chronic pain, I think Cymbalta would be a good choice for both - DULoxetine (CYMBALTA) 30 MG capsule; Take 1 capsule (30 mg total) by mouth daily.  Dispense: 30 capsule; Refill: 0 - will refer to Dr. Dianah Field, Sports Medicine, for consultation  2. Type 2 DM with polyneuropathy Lab Results  Component Value Date   HGBA1C 8.4 01/09/2017  - cont sliding scale insulin  3. Moderate episode of recurrent major depressive disorder (HCC) - discontinue Lexapro. Start Cymbalta 30mg  daily - close follow-up in 4 weeks   Patient education and anticipatory guidance given Patient agrees with treatment plan Follow-up in 1 month or sooner as needed  I spent 40 minutes with this patient, greater than 50% was face-to-face time counseling regarding the above diagnoses  Darlyne Russian PA-C

## 2017-01-08 NOTE — Patient Instructions (Signed)
Stop Lexapro. Start Cymbalta 30 mg daily.   

## 2017-01-09 DIAGNOSIS — K861 Other chronic pancreatitis: Secondary | ICD-10-CM | POA: Insufficient documentation

## 2017-01-09 DIAGNOSIS — M5416 Radiculopathy, lumbar region: Secondary | ICD-10-CM | POA: Insufficient documentation

## 2017-01-09 DIAGNOSIS — I708 Atherosclerosis of other arteries: Secondary | ICD-10-CM

## 2017-01-09 DIAGNOSIS — M4306 Spondylolysis, lumbar region: Secondary | ICD-10-CM | POA: Insufficient documentation

## 2017-01-09 DIAGNOSIS — Q349 Congenital malformation of respiratory system, unspecified: Secondary | ICD-10-CM | POA: Insufficient documentation

## 2017-01-09 DIAGNOSIS — I700 Atherosclerosis of aorta: Secondary | ICD-10-CM | POA: Insufficient documentation

## 2017-01-09 DIAGNOSIS — K5790 Diverticulosis of intestine, part unspecified, without perforation or abscess without bleeding: Secondary | ICD-10-CM | POA: Insufficient documentation

## 2017-01-09 DIAGNOSIS — N281 Cyst of kidney, acquired: Secondary | ICD-10-CM | POA: Insufficient documentation

## 2017-01-09 DIAGNOSIS — E278 Other specified disorders of adrenal gland: Secondary | ICD-10-CM | POA: Insufficient documentation

## 2017-01-09 DIAGNOSIS — M47812 Spondylosis without myelopathy or radiculopathy, cervical region: Secondary | ICD-10-CM | POA: Insufficient documentation

## 2017-01-09 LAB — POCT GLYCOSYLATED HEMOGLOBIN (HGB A1C): HEMOGLOBIN A1C: 8.4

## 2017-01-10 DIAGNOSIS — M7918 Myalgia, other site: Secondary | ICD-10-CM | POA: Insufficient documentation

## 2017-01-10 DIAGNOSIS — F331 Major depressive disorder, recurrent, moderate: Secondary | ICD-10-CM | POA: Insufficient documentation

## 2017-01-15 ENCOUNTER — Encounter: Payer: Self-pay | Admitting: Physician Assistant

## 2017-02-05 ENCOUNTER — Ambulatory Visit: Payer: Self-pay | Admitting: Physician Assistant

## 2017-02-06 ENCOUNTER — Ambulatory Visit (INDEPENDENT_AMBULATORY_CARE_PROVIDER_SITE_OTHER): Payer: Medicare Other | Admitting: Physician Assistant

## 2017-02-06 VITALS — BP 193/123 | HR 65 | Wt 173.0 lb

## 2017-02-06 DIAGNOSIS — M353 Polymyalgia rheumatica: Secondary | ICD-10-CM

## 2017-02-06 DIAGNOSIS — E1042 Type 1 diabetes mellitus with diabetic polyneuropathy: Secondary | ICD-10-CM | POA: Diagnosis not present

## 2017-02-06 DIAGNOSIS — E1065 Type 1 diabetes mellitus with hyperglycemia: Secondary | ICD-10-CM | POA: Diagnosis not present

## 2017-02-06 DIAGNOSIS — I10 Essential (primary) hypertension: Secondary | ICD-10-CM | POA: Diagnosis not present

## 2017-02-06 DIAGNOSIS — IMO0002 Reserved for concepts with insufficient information to code with codable children: Secondary | ICD-10-CM

## 2017-02-06 DIAGNOSIS — F332 Major depressive disorder, recurrent severe without psychotic features: Secondary | ICD-10-CM | POA: Diagnosis not present

## 2017-02-06 LAB — CBC WITH DIFFERENTIAL/PLATELET
BASOS ABS: 0 {cells}/uL (ref 0–200)
Basophils Relative: 0 %
EOS ABS: 297 {cells}/uL (ref 15–500)
Eosinophils Relative: 3 %
HCT: 47.4 % (ref 38.5–50.0)
Hemoglobin: 16.1 g/dL (ref 13.2–17.1)
Lymphocytes Relative: 37 %
Lymphs Abs: 3663 cells/uL (ref 850–3900)
MCH: 31.4 pg (ref 27.0–33.0)
MCHC: 34 g/dL (ref 32.0–36.0)
MCV: 92.4 fL (ref 80.0–100.0)
MONOS PCT: 9 %
MPV: 11.5 fL (ref 7.5–12.5)
Monocytes Absolute: 891 cells/uL (ref 200–950)
NEUTROS ABS: 5049 {cells}/uL (ref 1500–7800)
NEUTROS PCT: 51 %
PLATELETS: 205 10*3/uL (ref 140–400)
RBC: 5.13 MIL/uL (ref 4.20–5.80)
RDW: 13.8 % (ref 11.0–15.0)
WBC: 9.9 10*3/uL (ref 3.8–10.8)

## 2017-02-06 MED ORDER — DULOXETINE HCL 60 MG PO CPEP: 60.0000 mg | ORAL_CAPSULE | Freq: Every day | ORAL | 3 refills | Status: DC

## 2017-02-06 NOTE — Patient Instructions (Addendum)
-   Take Cymbalta 60mg  daily (I have increased your dose) - Please contact me if you are feeling suicidal. If it is after hours call the [facility] Call [phone] or go to the nearest emergency room - Follow-up with me in 1 month  - Make an appointment to see Dr. Georgina Snell (Sports Medicine)

## 2017-02-06 NOTE — Progress Notes (Signed)
HPI:                                                                Mark Brewer is a 71 y.o. male who presents to [facility] today for Cymbalta follow-up  Patient with history of chronic pain and neuropathy presents for 1 month Cymbalta follow-up. He has been taking Cymbalta daily. He states that being depressed daily feels normal to him. He endorses suicidal ideations nearly everyday, but states he has never formulated a plan. He said he "does not have the guts" to carry out self-harm and he would never want to do that to his wife or children. His anhedonia has worsened to the point that he is not able to be productive working from home. He states he avoids his home office and is unable to find the motivation to work. He feels like a failure. He denies symptoms of mania/hypomania. He denies AH/VH. He lives at home with his wife.  Patient continues to experience "whole body pain." He describes this as "heaviness" and "like lead is in flowing through my veins." He also describes tightness in his lower abdomen around his belt line.   Past Medical History:  Diagnosis Date  . Anxiety    chronic benzo use  . Chronic back pain   . Chronic pain disorder   . Depression   . Diabetes mellitus without complication ([place])   . History of heavy alcohol consumption   . Hypertension   . Insomnia   . Pancreatitis   . Small fiber neuropathy ([place]) 09/18/2016  . Tobacco use disorder    Past Surgical History:  Procedure Laterality Date  . HEMORROIDECTOMY     Social History  Substance Use Topics  . Smoking status: Current Every Day Smoker    Packs/day: 1.00    Years: 36.00    Types: Cigarettes  . Smokeless tobacco: Never Used  . Alcohol use No     Comment: former ETOH abuse, sober since 2007   family history is not on file. He was adopted.  ROS: negative except as noted in the HPI  Medications: Current Outpatient Prescriptions  Medication  Sig Dispense Refill  . baclofen (LIORESAL) 10 MG tablet Take 10 mg by mouth 3 (three) times daily.    . busPIRone (BUSPAR) 15 MG tablet TAKE 1/2 TABLET TWICE DAILY    . DULoxetine (CYMBALTA) 60 MG capsule Take 1 capsule (60 mg total) by mouth daily. 30 capsule 3  . insulin aspart (NOVOLOG) 100 UNIT/ML injection Inject 20-40 Units into the skin 3 (three) times daily with meals.     Marland Kitchen omeprazole (PRILOSEC) 40 MG capsule Take 40 mg by mouth daily.    Marland Kitchen oxyCODONE (ROXICODONE) 15 MG immediate release tablet Take by mouth.    . propranolol (INDERAL) 40 MG tablet Take by mouth.    . zolpidem (AMBIEN) 10 MG tablet Take one tablet (10 mg total) by mouth at bedtime as needed for Sleep. Take one tablet by mouth at bedtime as needed for sleep.     No current facility-administered medications for this visit.    Allergies  Allergen Reactions  . Beta Adrenergic Blockers     Other reaction(s): Other (See Comments) hypoglycemia  . Pregabalin  Other reaction(s): Other Weight gain   . Ace Inhibitors     Other reaction(s): Unknown Weakness  . Codeine     REACTION: GI upset       Objective:  BP (!) 193/123   Pulse 65   Wt 173 lb (78.5 kg)   BMI 25.18 kg/m  Gen: well-groomed, cooperative, not ill-appearing, no distress HEENT: normal conjunctiva, sclera white, no icterus Pulm: Normal work of breathing, normal phonation CV: Normal rate, regular rhythm, s1 and s2 distinct, no murmurs, clicks or rubs  GI: soft, nondistended, nontender, no rebound or guarding, no masses Neuro: alert and oriented x 3, EOM's intact,  MSK: no spinous process tenderness or step off deformity, strength 5/5 in bilateral upper and lower extremities, normal gait and station Skin: warm and dry, no rashes or lesions on exposed skin, no cyanosis, no jaundice Psych: good eye contact, depressed mood, thought content is ruminating/perseverating, thought process is tangential, concentration fair, speech normal  rate  Depression screen [facility] 2/9 02/06/2017  Decreased Interest 3  Down, Depressed, Hopeless 3  PHQ - 2 Score 6  Altered sleeping 3  Tired, decreased energy 3  Change in appetite 0  Feeling bad or failure about yourself  3  Trouble concentrating 3  Moving slowly or fidgety/restless 3  Suicidal thoughts 3  PHQ-9 Score 24    Assessment and Plan: 71 y.o. male with  1. Severe episode of recurrent major depressive disorder, without psychotic features ([place]) - increasing Cymbalta to 60mg  daily - patient contracted for safety. I have questioned him thoroughly about his suicidal ideations and I feel he is able to maintain his safety of self. Provided with [facility] and encouraged him to contact me for worsening SI - DULoxetine (CYMBALTA) 60 MG capsule; Take 1 capsule (60 mg total) by mouth daily.  Dispense: 30 capsule; Refill: 3 - follow-up in 4 weeks   2. Polymyalgia ([place]) - likely multifactorial given patient's imaging and nerve conduction studies have shown both degenerative disc disease, radiculopathy and sensorimotor polyneuropathy. Recommended follow-up with Dr. Lynne Leader, Sports Medicine - ordering a rheumatology panel to r/o myositis or PMR - I am hoping that Cymbalta will also help with patient's myalgias - CBC with Differential/Platelet - CK - ANA - Comprehensive metabolic panel - C-reactive protein - Cyclic citrul peptide antibody, IgG - Rheumatoid factor - Sedimentation rate  3. Uncontrolled hypertension - unfortunately due to patient noncompliance with medications. Patient's depression is very severe and he has expressed apathy toward managing his chronic conditions - denies vision change, chest pain with exertion, dyspnea, lightheadedness, edema or syncope - cont Propranolol  4. 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 Patient's depression is very  severe and he has expressed apathy toward managing his chronic conditions - plan to approach this again at 4-week follow-up when patient's depression is hopefully better controlled  Patient education and anticipatory guidance given Patient agrees with treatment plan Follow-up in 4 weeks or sooner as needed   Darlyne Russian PA-C

## 2017-02-07 ENCOUNTER — Encounter: Payer: Self-pay | Admitting: Physician Assistant

## 2017-02-07 ENCOUNTER — Ambulatory Visit (INDEPENDENT_AMBULATORY_CARE_PROVIDER_SITE_OTHER): Payer: Medicare Other | Admitting: Family Medicine

## 2017-02-07 VITALS — BP 201/92 | HR 75 | Wt 172.0 lb

## 2017-02-07 DIAGNOSIS — I10 Essential (primary) hypertension: Secondary | ICD-10-CM | POA: Insufficient documentation

## 2017-02-07 DIAGNOSIS — M791 Myalgia: Secondary | ICD-10-CM | POA: Diagnosis not present

## 2017-02-07 DIAGNOSIS — E1042 Type 1 diabetes mellitus with diabetic polyneuropathy: Secondary | ICD-10-CM | POA: Insufficient documentation

## 2017-02-07 DIAGNOSIS — M353 Polymyalgia rheumatica: Secondary | ICD-10-CM | POA: Insufficient documentation

## 2017-02-07 DIAGNOSIS — E1065 Type 1 diabetes mellitus with hyperglycemia: Secondary | ICD-10-CM

## 2017-02-07 DIAGNOSIS — IMO0002 Reserved for concepts with insufficient information to code with codable children: Secondary | ICD-10-CM | POA: Insufficient documentation

## 2017-02-07 DIAGNOSIS — G629 Polyneuropathy, unspecified: Secondary | ICD-10-CM

## 2017-02-07 DIAGNOSIS — M7918 Myalgia, other site: Secondary | ICD-10-CM

## 2017-02-07 LAB — COMPREHENSIVE METABOLIC PANEL
ALT: 31 U/L (ref 9–46)
AST: 25 U/L (ref 10–35)
Albumin: 4.1 g/dL (ref 3.6–5.1)
Alkaline Phosphatase: 109 U/L (ref 40–115)
BILIRUBIN TOTAL: 0.8 mg/dL (ref 0.2–1.2)
BUN: 10 mg/dL (ref 7–25)
CO2: 24 mmol/L (ref 20–31)
Calcium: 9.3 mg/dL (ref 8.6–10.3)
Chloride: 102 mmol/L (ref 98–110)
Creat: 0.74 mg/dL (ref 0.70–1.25)
Glucose, Bld: 191 mg/dL — ABNORMAL HIGH (ref 65–99)
POTASSIUM: 4 mmol/L (ref 3.5–5.3)
Sodium: 138 mmol/L (ref 135–146)
Total Protein: 6 g/dL — ABNORMAL LOW (ref 6.1–8.1)

## 2017-02-07 LAB — ANA: Anti Nuclear Antibody(ANA): NEGATIVE

## 2017-02-07 LAB — CK: Total CK: 76 U/L (ref 7–232)

## 2017-02-07 LAB — CYCLIC CITRUL PEPTIDE ANTIBODY, IGG: Cyclic Citrullin Peptide Ab: <16 Units

## 2017-02-07 LAB — RHEUMATOID FACTOR: Rhuematoid fact SerPl-aCnc: <14 IU/mL (ref <14)

## 2017-02-07 LAB — C-REACTIVE PROTEIN: CRP: 5 mg/L (ref <8.0)

## 2017-02-07 MED ORDER — HYDROCHLOROTHIAZIDE 25 MG PO TABS: 25.0000 mg | ORAL_TABLET | Freq: Every day | ORAL | 2 refills | Status: AC

## 2017-02-07 NOTE — Patient Instructions (Signed)
Thank you for coming in today. Recheck in 2 weeks.  Start HCTZ for blood pressure.  Attend Physical Therapy.   Make sure the follow up appointment is for 30 minutes.

## 2017-02-08 LAB — SEDIMENTATION RATE: Sed Rate: 1 mm/hr (ref 0–20)

## 2017-02-08 NOTE — Progress Notes (Signed)
Mark Brewer is a 71 y.o. male who presents to [facility] today for evaluate of paresthesias and back pain. Patient has a long complicated history of paresthesias and back pain.  This all started several years ago when he was trying to pull apart a metal object. He had back pain and numbness across his body. Since then his symptoms have continued and these had an extensive workup. Unfortunately is not clear based on previous records what exactly his diagnosis is. He notes that he's had an abnormal nerve conduction study as well as multiple MRIs that showed some degenerative changes in the spine. Notes that these symptoms are very bothersome and make it difficult to work.  His primary complaints are 1) pain across his thoracic spine and posterior shoulders  2) extremity weakness upper and lower  3) abdominal pressure  Additionally today he notes that his blood pressures been elevated sometimes. He is very worried about the possibility of having a stroke.     Past Medical History:  Diagnosis Date  . Anxiety    chronic benzo use  . Chronic back pain   . Chronic pain disorder   . Depression   . Diabetes mellitus without complication ([place])   . History of heavy alcohol consumption   . Hypertension   . Insomnia   . Pancreatitis   . Small fiber neuropathy ([place]) 09/18/2016  . Tobacco use disorder    Past Surgical History:  Procedure Laterality Date  . HEMORROIDECTOMY     Social History  Substance Use Topics  . Smoking status: Current Every Day Smoker    Packs/day: 1.00    Years: 36.00    Types: Cigarettes  . Smokeless tobacco: Never Used  . Alcohol use No     Comment: former ETOH abuse, sober since 2007     ROS:  As above   Medications: Current Outpatient Prescriptions  Medication Sig Dispense Refill  . aspirin (GOODSENSE ASPIRIN) 325 MG tablet 1 tablet    . Aspirin-Caffeine (BC FAST PAIN RELIEF) 845-65 MG PACK 1 packet     . baclofen (LIORESAL) 10 MG tablet Take 10 mg by mouth 3 (three) times daily.    . busPIRone (BUSPAR) 15 MG tablet TAKE 1/2 TABLET TWICE DAILY    . diazepam (VALIUM) 2 MG tablet 1 tablet as needed    . DULoxetine (CYMBALTA) 60 MG capsule Take 1 capsule (60 mg total) by mouth daily. 30 capsule 3  . hydrochlorothiazide (HYDRODIURIL) 25 MG tablet Take 1 tablet (25 mg total) by mouth daily. 30 tablet 2  . insulin aspart (NOVOLOG) 100 UNIT/ML injection Inject 20-40 Units into the skin 3 (three) times daily with meals.     . insulin NPH-regular Human (HUMULIN 70/30) (70-30) 100 UNIT/ML injection Slide Scale    . omeprazole (PRILOSEC) 40 MG capsule Take 40 mg by mouth daily.    Marland Kitchen oxyCODONE (ROXICODONE) 15 MG immediate release tablet Take by mouth.    . propranolol (INDERAL) 40 MG tablet Take by mouth.    Marland Kitchen Specialty Vitamins Products (CENTRUM PERFORMANCE) TABS     . zolpidem (AMBIEN) 10 MG tablet Take one tablet (10 mg total) by mouth at bedtime as needed for Sleep. Take one tablet by mouth at bedtime as needed for sleep.     No current facility-administered medications for this visit.    Allergies  Allergen Reactions  . Beta Adrenergic Blockers     Other reaction(s): Other (See Comments) hypoglycemia  .  Pregabalin     Other reaction(s): Other Weight gain   . Ace Inhibitors     Other reaction(s): Unknown Weakness  . Codeine     REACTION: GI upset     Exam:  BP (!) 201/92   Pulse 75   Wt 172 lb (78 kg)   BMI 25.04 kg/m  General: Well Developed, well nourished, and in no acute distress.  Neuro/Psych: Alert and oriented x3, extra-ocular muscles intact, able to move all 4 extremities, sensation grossly intact.  Patient has a flat affect and is extremely tangential. The history is very difficult to obtain. Skin: Warm and dry, no rashes noted.  Respiratory: Not using accessory muscles, speaking in full sentences, trachea midline.  Cardiovascular: Pulses palpable, no extremity  edema. Abdomen: Does not appear distended. MSK: Extremities are nontender. Strength is diminished although patient can walk normally.   EMG Report: EMG performed on 11/02/2015 . 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 The findings also suggest an active right L5-S1 radiculopathy, chronic left L5-S1 radiculopathy, and mild chronic bilateral L2-L4 radiculopathy. The needle EMG findings are relatively unchanged from the previous study performed in 2015   IMPRESSION: Mild degenerative disc disease, perhaps slightly worse at C5-C6 and C6-C7. Buckling of ligamentum flavum. Cord contact at multiple levels and minimal cord compression C5-C6 and C6-C7. Multilevel foraminal narrowing  Result Narrative  MRI cervical spine without contrast:  INDICATION:Paresthesias. Thoracic spine pain.  TECHNIQUE: Sagittal T1, T2, and STIR images were performed. Axial T2-weighted images. Then following IV administration of 17 cc of MultiHance, repeat sagittal and axial T1-weighted postcontrast images were obtained.  COMPARISON:11/10/2013  FINDINGS: #Vertebral bodies: No fracture. There is a vertebral hemangioma in C7. This is mixed fatty and vascular relative to the typical hemangioma. #Alignment: Normal #Marrow signal: As above, otherwise benign #Cervical spinal canal: There is multifocal cord contact or compression. No abnormal cord signal. No primary intradural pathology. #Craniovertebral junction: Normal.  #Additional findings: Interval decrease in enhancement of the C3-C4 disc appearing to confirm that the previous finding was because acute disc herniation.  #C2-3: Minimal disc bulge. Slight buckling of ligamentum flavum. Borderline central stenosis. No cord contact or compression. Neuroforamen are patent. #C3-4: Mild disc protrusion slightly eccentric to the right. Bilateral uncal spurs. Buckling of ligamentum flavum.  Minimal central stenosis. Adjacency to the ventral cord without compression. Mild-to-moderate bilateral foraminal stenosis. #C4-5: Minimal central disc protrusion superimposed on a mild bulge. Buckling of ligamentum flavum. Borderline central stenosis. Mild right foraminal stenosis. Annular fissure at the posterior disc margin. #C5-6: Mild disc degeneration with broad posterior bulge. Bilateral uncal spurs. Thickening of ligamentum flavum. Mild central stenosis with dorsal and ventral cord contact. Only minimal cord compression. Moderate left and mild right foraminal  stenosis. #C6-7: Broad posterior disc protrusion. Bilateral uncal spurs. Adjacency to the dorsal and ventral cord with minimal cord compression. Uncal spurs cause moderate to severe bilateral foraminal stenosis. #C7-T1: Mild disc bulge. Uncal spurs bilaterally. Borderline canal narrowing. Mild bilateral foraminal stenosis.    Addendum by Eddie Dibbles, MD on 06/07/2016  3:25 PM Addendum: Stable ectatic thoracic aorta. The diameter of the distal aortic  arch/proximal descending thoracic aorta junction measures approximately  3.6 cm in diameter. The distal descending thoracic aorta measures 3.2 x  3.3 cm in the axial plane. Again,  these findings are similar to the previous study in 2014.Marland Kitchen  Result Impression  IMPRESSION: 1.No acute findings or significant change from prior study. 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 Again no intrinsic signal abnormality appreciated within the spinal cord. Clinical significance of this finding uncertain.  Result Narrative  INDICATION: R20.0: Anesthesia of skin R20.2: Paresthesia of skin N52.9: Male erectile dysfunction, unspecified M54.6: Pain in thoracic spine.  COMPARISON: 11/10/2013  TECHNIQUE: Multiplanar, multisequence imaging obtained through the thoracic spine before and after IV contrast.  Contrast - 17 cc MultiHance  FINDINGS:  #Osseous structures: Vertebral body heights are maintained. No acute fracture or concerning marrow signal abnormality. #Alignment: Alignment maintained. #Spinal cord: There is mild focal prominence of the dorsal subarachnoid space with anterior displacement and mild flattening of the posterior aspect of the spinal cord at the level of T3. Imaging appearance consistent with a small arachnoid web/dorsal  intradural arachnoid cyst. Similar appearance present on the previous study. Again no intrinsic spinal cord abnormality.  #Disc levels: Mild multilevel degenerative changes without focal disc herniation, significant spinal canal or foraminal stenosis.  #Paraspinal tissues: Unremarkable.  #Contrast: No worrisome enhancement.        No results found.    Assessment and Plan: 71 y.o. male with  Paresthesias: Patient likely has multifactorial paresthesias. I think is likely some of this is nerve impingement or cord compression as well as some of it being neuropathy. He has multiple different causes for neuropathy including not well-controlled diabetes, previous history of alcohol abuse and according to previous notes probably some immunologic cause.  Unfortunately according to the patient and previous notes he has had lots of different treatments none of which have worked very well. He has not yet had physical therapy and I think that's a reasonable thing to try. Additionally his primary care provider is starting Cymbalta which I think is a good idea. I think fundamentally we should work on getting his body and a little bit better shape. Controlling diabetes would help with neuropathy. Plan to start physical therapy and recheck in 2 weeks.   Hypertension: Uncontrolled today. Start hydrochlorothiazide recheck in 2 weeks.  Abdominal pressure: Likely constipation due to opiates. We'll recheck in 2 weeks and versus topical patient.  Orders  Placed This Encounter  Procedures  . Ambulatory referral to Physical Therapy    Referral Priority:   Routine    Referral Type:   Physical Medicine    Referral Reason:   Specialty Services Required    Requested Specialty:   Physical Therapy    Number of Visits Requested:   1    Discussed warning signs or symptoms. Please see discharge instructions. Patient expresses understanding.  I spent 40 minutes with this patient, greater than 50% was face-to-face time counseling regarding the above diagnosis.

## 2017-02-13 ENCOUNTER — Telehealth: Payer: Self-pay | Admitting: Physician Assistant

## 2017-02-13 DIAGNOSIS — Z79899 Other long term (current) drug therapy: Secondary | ICD-10-CM | POA: Insufficient documentation

## 2017-02-13 MED ORDER — DULOXETINE HCL 20 MG PO CPEP: 20.0000 mg | ORAL_CAPSULE | Freq: Two times a day (BID) | ORAL | 3 refills | Status: DC

## 2017-02-13 NOTE — Telephone Encounter (Signed)
Patient called and left a voicemail stating he believed he was having an adverse medication reaction to Cymbalta 60mg  daily. He was previously tolerating Cymbalta 30mg  daily without difficulty. Patient states he was having sweating, vomiting, and weight loss. He self-discontinued his Cymbalta over the weekend. He also began Hydrochlorothiazide 25mg  on the same day. Patient is also on narcotic pain medication and at risk for polypharmacy.  I spoke to patient and his wife on the phone this afternoon. Patient's wife states his blood pressure was 120/74 today. I asked if patient was checking his blood sugar, but he was unable to give me a number/straight answer. I am sending a prescription for Cymbalta 20mg  bid. Patient has severe major depressive disorder and should not be without medication. He has a follow-up appointment on 02/21/2017. Advised to return sooner for worsening symptoms.

## 2017-02-21 ENCOUNTER — Ambulatory Visit: Payer: Medicare Other | Admitting: Family Medicine

## 2017-02-22 ENCOUNTER — Ambulatory Visit: Payer: Self-pay | Admitting: Physical Therapy

## 2017-03-04 DIAGNOSIS — M47816 Spondylosis without myelopathy or radiculopathy, lumbar region: Secondary | ICD-10-CM | POA: Diagnosis not present

## 2017-03-04 DIAGNOSIS — M5136 Other intervertebral disc degeneration, lumbar region: Secondary | ICD-10-CM | POA: Diagnosis not present

## 2017-03-04 DIAGNOSIS — M5416 Radiculopathy, lumbar region: Secondary | ICD-10-CM | POA: Diagnosis not present

## 2017-03-04 DIAGNOSIS — Z79891 Long term (current) use of opiate analgesic: Secondary | ICD-10-CM | POA: Diagnosis not present

## 2017-03-06 ENCOUNTER — Ambulatory Visit (INDEPENDENT_AMBULATORY_CARE_PROVIDER_SITE_OTHER): Payer: Medicare Other | Admitting: Physician Assistant

## 2017-03-06 VITALS — BP 121/74 | HR 64 | Wt 165.0 lb

## 2017-03-06 DIAGNOSIS — L858 Other specified epidermal thickening: Secondary | ICD-10-CM

## 2017-03-06 DIAGNOSIS — F332 Major depressive disorder, recurrent severe without psychotic features: Secondary | ICD-10-CM

## 2017-03-06 DIAGNOSIS — R6882 Decreased libido: Secondary | ICD-10-CM | POA: Diagnosis not present

## 2017-03-06 DIAGNOSIS — I10 Essential (primary) hypertension: Secondary | ICD-10-CM | POA: Diagnosis not present

## 2017-03-06 MED ORDER — VILAZODONE HCL 10 MG PO TABS: 10.0000 mg | ORAL_TABLET | Freq: Every day | ORAL | 3 refills | Status: DC

## 2017-03-06 NOTE — Progress Notes (Signed)
HPI:                                                                Mark Brewer is a 71 y.o. male who presents to [facility] today for depression/HTN follow-up  HTN: taking hydrochlorothiazide daily. Compliant with medications. Checks BP's at home. BP range 110-120's/70's-80's  . Denies vision change, headache, chest pain with exertion, orthopnea, lightheadedness syncope and edema. Risk factors include: heavy tobacco use, diabetes  Depression/Anxiety: self-discontinued Cymbalta due to nausea, vomiting and general intolerance. He is taking Buspar 1-2 times daily, Valium prn and Ambien nightly, which is prescribed by [facility]. He is very concerned about experiencing sexual side effects from antidepressants. He endorses low libido and he would like to have his testosterone checked again.    Past Medical History:  Diagnosis Date  . Anxiety    chronic benzo use  . Chronic back pain   . Chronic pain disorder   . Depression   . Diabetes mellitus without complication ([place])   . History of heavy alcohol consumption   . Hypertension   . Insomnia   . Pancreatitis   . Small fiber neuropathy ([place]) 09/18/2016  . Tobacco use disorder    Past Surgical History:  Procedure Laterality Date  . HEMORROIDECTOMY     Social History  Substance Use Topics  . Smoking status: Current Every Day Smoker    Packs/day: 1.00    Years: 36.00    Types: Cigarettes  . Smokeless tobacco: Never Used  . Alcohol use No     Comment: former ETOH abuse, sober since 2007   family history is not on file. He was adopted.  ROS: negative except as noted in the HPI  Medications: Current Outpatient Prescriptions  Medication Sig Dispense Refill  . aspirin (GOODSENSE ASPIRIN) 325 MG tablet 1 tablet    . Aspirin-Caffeine (BC FAST PAIN RELIEF) 845-65 MG PACK 1 packet    . baclofen (LIORESAL) 10 MG tablet Take 10 mg by mouth 3 (three) times daily.    .  busPIRone (BUSPAR) 15 MG tablet TAKE 1/2 TABLET TWICE DAILY    . diazepam (VALIUM) 2 MG tablet 1 tablet as needed    . DULoxetine (CYMBALTA) 20 MG capsule Take 1 capsule (20 mg total) by mouth 2 (two) times daily. 60 capsule 3  . hydrochlorothiazide (HYDRODIURIL) 25 MG tablet Take 1 tablet (25 mg total) by mouth daily. 30 tablet 2  . insulin aspart (NOVOLOG) 100 UNIT/ML injection Inject 20-40 Units into the skin 3 (three) times daily with meals.     . insulin NPH-regular Human (HUMULIN 70/30) (70-30) 100 UNIT/ML injection Slide Scale    . omeprazole (PRILOSEC) 40 MG capsule Take 40 mg by mouth daily.    Marland Kitchen oxyCODONE (ROXICODONE) 15 MG immediate release tablet Take by mouth.    . propranolol (INDERAL) 40 MG tablet Take by mouth.    Marland Kitchen Specialty Vitamins Products (CENTRUM PERFORMANCE) TABS     . zolpidem (AMBIEN) 10 MG tablet Take one tablet (10 mg total) by mouth at bedtime as needed for Sleep. Take one tablet by mouth at bedtime as needed for sleep.     No current facility-administered medications for this visit.    Allergies  Allergen Reactions  . Beta Adrenergic Blockers     Other reaction(s): Other (See Comments) hypoglycemia  . Pregabalin     Other reaction(s): Other Weight gain   . Ace Inhibitors     Other reaction(s): Unknown Weakness  . Codeine     REACTION: GI upset       Objective:  BP 121/74   Pulse 64   Wt 165 lb (74.8 kg)   BMI 24.02 kg/m  Gen: well-groomed, cooperative, not ill-appearing, no distress Pulm: Normal work of breathing, normal phonation, clear to auscultation bilaterally CV: Normal rate, regular rhythm, s1 and s2 distinct, no murmurs, clicks or rubs  Neuro: alert and oriented x 3, EOM's intact MSK: moving all extremities, normal gait and station, no peripheral edema Psych: good eye contact, depressed mood, thought process tangential, thought content ruminations Skin: cutaneous horn present on the left shoulder   No results found for this or any  previous visit (from the past 72 hour(s)). No results found.    Assessment and Plan: 71 y.o. male with   1. Low libido - Testosterone Total,Free,Bio, Males  2. Severe episode of recurrent major depressive disorder, without psychotic features (HCC) - Vilazodone HCl (VIIBRYD) 10 MG TABS; Take 1 tablet (10 mg total) by mouth daily.  Dispense: 30 tablet; Refill: 3 - follow-up in 4 weeks  3. Cutaneous horn - recommend excisional skin biopsy - return in 1 week  4. Essential HTN - BP in range today - cont Hydrochlorthiazide  Patient education and anticipatory guidance given Patient agrees with treatment plan Follow-up in 1 week or sooner as needed   Darlyne Russian PA-C

## 2017-03-06 NOTE — Patient Instructions (Addendum)
-   Start Viibryd 10mg  daily for depression - Follow-up in 4 weeks  - Schedule an appointment for a skin biopsy with Dr. Georgina Snell

## 2017-03-10 DIAGNOSIS — L858 Other specified epidermal thickening: Secondary | ICD-10-CM | POA: Insufficient documentation

## 2017-03-13 ENCOUNTER — Ambulatory Visit (INDEPENDENT_AMBULATORY_CARE_PROVIDER_SITE_OTHER): Payer: Medicare Other | Admitting: Physician Assistant

## 2017-03-13 ENCOUNTER — Ambulatory Visit (INDEPENDENT_AMBULATORY_CARE_PROVIDER_SITE_OTHER): Payer: Medicare Other

## 2017-03-13 VITALS — BP 129/91 | HR 89 | Wt 164.0 lb

## 2017-03-13 DIAGNOSIS — M25662 Stiffness of left knee, not elsewhere classified: Principal | ICD-10-CM

## 2017-03-13 DIAGNOSIS — M25661 Stiffness of right knee, not elsewhere classified: Secondary | ICD-10-CM | POA: Diagnosis not present

## 2017-03-13 DIAGNOSIS — I739 Peripheral vascular disease, unspecified: Secondary | ICD-10-CM | POA: Diagnosis not present

## 2017-03-13 DIAGNOSIS — M25561 Pain in right knee: Secondary | ICD-10-CM | POA: Diagnosis not present

## 2017-03-13 DIAGNOSIS — Z79899 Other long term (current) drug therapy: Secondary | ICD-10-CM

## 2017-03-13 DIAGNOSIS — M25562 Pain in left knee: Secondary | ICD-10-CM | POA: Diagnosis not present

## 2017-03-13 DIAGNOSIS — I70209 Unspecified atherosclerosis of native arteries of extremities, unspecified extremity: Secondary | ICD-10-CM | POA: Diagnosis not present

## 2017-03-13 DIAGNOSIS — I70201 Unspecified atherosclerosis of native arteries of extremities, right leg: Secondary | ICD-10-CM

## 2017-03-13 MED ORDER — ATORVASTATIN CALCIUM 20 MG PO TABS: 20.0000 mg | ORAL_TABLET | Freq: Every day | ORAL | 3 refills | Status: DC

## 2017-03-13 NOTE — Patient Instructions (Signed)
-   Go downstairs for your knee X-rays and make a follow-up appointment with Dr. Georgina Snell  - I will call [facility] to discuss Viibryd. In the meantime, I recommend you fill and start the medication  - Follow-up in 4 weeks

## 2017-03-13 NOTE — Progress Notes (Signed)
HPI:                                                                Mark Brewer is a 71 y.o. male who presents to [facility]: [facility] today for bilateral knee stiffness  Patient reports bilateral knee stiffness beginning this week. He notices this most when he is walking down stairs, but he also notices it at rest. He denies any swelling, redness, tenderness or warmth of the joint. He denies any known injury or trauma. He denies fever, chills, or weight loss. He is concerned that he has a "collagen vascular disease" and would like to be tested for this.   Additionally, patient is requesting a prescription for Adderall. Patient states his daughter gave him one of her prescription pills to try and he found that it worked very well to focus him. He states that he was almost kicked out of his pain clinic for doing this and would like a prescription.   Past Medical History:  Diagnosis Date  . Anxiety    chronic benzo use  . Chronic back pain   . Chronic pain disorder   . Depression   . Diabetes mellitus without complication ([place])   . History of heavy alcohol consumption   . Hypertension   . Insomnia   . Pancreatitis   . Small fiber neuropathy ([place]) 09/18/2016  . Tobacco use disorder    Past Surgical History:  Procedure Laterality Date  . HEMORROIDECTOMY     Social History  Substance Use Topics  . Smoking status: Current Every Day Smoker    Packs/day: 1.00    Years: 36.00    Types: Cigarettes  . Smokeless tobacco: Never Used  . Alcohol use No     Comment: former ETOH abuse, sober since 2007   family history is not on file. He was adopted.  ROS: negative except as noted in the HPI  Medications: Current Outpatient Prescriptions  Medication Sig Dispense Refill  . aspirin (GOODSENSE ASPIRIN) 325 MG tablet 1 tablet    . Aspirin-Caffeine (BC FAST PAIN RELIEF) 845-65 MG PACK 1 packet    . baclofen (LIORESAL) 10 MG tablet Take 10 mg by  mouth 3 (three) times daily.    . busPIRone (BUSPAR) 15 MG tablet TAKE 1/2 TABLET TWICE DAILY    . diazepam (VALIUM) 2 MG tablet 1 tablet as needed    . hydrochlorothiazide (HYDRODIURIL) 25 MG tablet Take 1 tablet (25 mg total) by mouth daily. 30 tablet 2  . insulin aspart (NOVOLOG) 100 UNIT/ML injection Inject 20-40 Units into the skin 3 (three) times daily with meals.     . insulin NPH-regular Human (HUMULIN 70/30) (70-30) 100 UNIT/ML injection Slide Scale    . omeprazole (PRILOSEC) 40 MG capsule Take 40 mg by mouth daily.    Marland Kitchen oxyCODONE (ROXICODONE) 15 MG immediate release tablet Take by mouth.    . propranolol (INDERAL) 40 MG tablet Take by mouth.    Marland Kitchen Specialty Vitamins Products (CENTRUM PERFORMANCE) TABS     . Vilazodone HCl (VIIBRYD) 10 MG TABS Take 1 tablet (10 mg total) by mouth daily. 30 tablet 3  . zolpidem (AMBIEN) 10 MG tablet Take one tablet (10 mg total) by mouth at bedtime as  needed for Sleep. Take one tablet by mouth at bedtime as needed for sleep.     No current facility-administered medications for this visit.    Allergies  Allergen Reactions  . Beta Adrenergic Blockers     Other reaction(s): Other (See Comments) hypoglycemia  . Pregabalin     Other reaction(s): Other Weight gain   . Ace Inhibitors     Other reaction(s): Unknown Weakness  . Codeine     REACTION: GI upset       Objective:  BP (!) 129/91   Pulse 89   Wt 164 lb (74.4 kg)   BMI 23.87 kg/m  Gen: well-groomed, cooperative, not ill-appearing, no distress Pulm: Normal work of breathing, normal phonation, clear to auscultation bilaterally CV: Normal rate, regular rhythm, s1 and s2 distinct, no murmurs, clicks or rubs  Neuro: alert and oriented x 3, EOM's intact MSK: Knees - atraumatic, no edema, no crepitus, full active ROM, strength 5/5 in lower extremities bilaterally; normal gait and station, no peripheral edema Psych: good eye contact, normal affect, depressed mood, normal speech and thought  content   Assessment and Plan: 71 y.o. male with   1. Knee joint stiffness, bilateral - explained extremely low likelihood of a collagen vascular disorder and that testing for this is not necessary at this point. Explained that we should start with knee x-rays and look for arthritis, which is a more common cause of joint stiffness/pain in his age group - DG Knee Complete 4 Views Left; Future - DG Knee Complete 4 Views Right; Future  2. 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 Patient is also a diabetic and 1ppd smoker. Prescription sent for Atorvastatin - atorvastatin (LIPITOR) 20 MG tablet; Take 1 tablet (20 mg total) by mouth daily. (Patient not taking: Reported on 03/18/2017)  Dispense: 90 tablet; Refill: 3  3. Polypharmacy - discussed that inattention is a common symptom of major depressive disorder and if we could control his depression, this would likely improve - encouraged him to fill his prescription for Viibryd - explained that I would not prescribe him Adderall due to his history of uncontrolled hypertension   Patient education and anticipatory guidance given Patient agrees with treatment plan Follow-up in 4 weeks or sooner as needed if symptoms worsen or fail to improve  Darlyne Russian PA-C

## 2017-03-14 ENCOUNTER — Telehealth: Payer: Self-pay | Admitting: *Deleted

## 2017-03-14 ENCOUNTER — Telehealth: Payer: Self-pay

## 2017-03-14 NOTE — Telephone Encounter (Signed)
Per patient request I did call [facility] to see if a PA was needed for Viibryd. I have not received anything from the insurance company indicating a PA was needed. The representative I spoke with ran a test claim while I was on the phone and she stated a PA was not needed. The next available fill date is 03/31/2017. A PA may be generated if the patient was trying to get a refill before the available fill date.( if he was taking more than one a day and ran out early?) called the patient and left a vm to give me a call back to get more information in regard to his request.

## 2017-03-14 NOTE — Telephone Encounter (Signed)
-----   Message from [place], [place] sent at 03/13/2017  1:53 PM EDT ----- His x-rays showed plaque in the arteries (blood vessel) of his leg. Additionally, due to his diabetes, this is a risk factor for heart disease. I sent a medication to his pharmacy called Atorvastatin. I recommend he take 1 pill at bedtime.

## 2017-03-14 NOTE — Telephone Encounter (Signed)
1.[place]

## 2017-03-14 NOTE — Telephone Encounter (Signed)
Left vm for pt to return call to clinic -EH/RMA  

## 2017-03-18 ENCOUNTER — Ambulatory Visit (INDEPENDENT_AMBULATORY_CARE_PROVIDER_SITE_OTHER): Payer: Medicare Other | Admitting: Family Medicine

## 2017-03-18 VITALS — BP 139/78 | HR 61 | Wt 165.0 lb

## 2017-03-18 DIAGNOSIS — L858 Other specified epidermal thickening: Secondary | ICD-10-CM

## 2017-03-18 DIAGNOSIS — B078 Other viral warts: Secondary | ICD-10-CM | POA: Diagnosis not present

## 2017-03-18 NOTE — Patient Instructions (Signed)
Thank you for coming in today. Return to [facility] in 1 week to remove the stitches.  Return sooner if needed.  Change the dressing tomorrow.  Keep the wound covered with ointment.    Sutured Wound Care Sutures are stitches that can be used to close wounds. Taking care of your wound properly can help to prevent pain and infection. It can also help your wound to heal more quickly. How is this treated? Wound Care   Keep the wound clean and dry.  If you were given a bandage (dressing), you should change it at least once per day or as directed by your health care provider. You should also change it if it becomes wet or dirty.  Keep the wound completely dry for the first 24 hours or as directed by your health care provider. After that time, you may shower or bathe. However, make sure that the wound is not soaked in water until the sutures have been removed.  Clean the wound one time each day or as directed by your health care provider.  Wash the wound with soap and water.  Rinse the wound with water to remove all soap.  Pat the wound dry with a clean towel. Do not rub the wound.  Aftercleaning the wound, apply a thin layer of antibioticointment as directed by your health care provider. This will help to prevent infection and keep the dressing from sticking to the wound.  Have the sutures removed as directed by your health care provider. General Instructions   Take or apply medicines only as directed by your health care provider.  To help prevent scarring, make sure to cover your wound with sunscreen whenever you are outside after the sutures are removed and the wound is healed. Make sure to wear a sunscreen of at least 30 SPF.  If you were prescribed an antibiotic medicine or ointment, finish all of it even if you start to feel better.  Do not scratch or pick at the wound.  Keep all follow-up visits as directed by your health care provider. This is important.  Check your wound  every day for signs of infection. Watch for:  Redness, swelling, or pain.  Fluid, blood, or pus.  Raise (elevate) the injured area above the level of your heart while you are sitting or lying down, if possible.  Avoid stretching your wound.  Drink enough fluids to keep your urine clear or pale yellow. Contact a health care provider if:  You received a tetanus shot and you have swelling, severe pain, redness, or bleeding at the injection site.  You have a fever.  A wound that was closed breaks open.  You notice a bad smell coming from the wound.  You notice something coming out of the wound, such as wood or glass.  Your pain is not controlled with medicine.  You have increased redness, swelling, or pain at the site of your wound.  You have fluid, blood, or pus coming from your wound.  You notice a change in the color of your skin near your wound.  You need to change the dressing frequently due to fluid, blood, or pus draining from the wound.  You develop a new rash.  You develop numbness around the wound. Get help right away if:  You develop severe swelling around the injury site.  Your pain suddenly increases and is severe.  You develop painful lumps near the wound or on skin that is anywhere on your body.  You have  a red streak going away from your wound.  The wound is on your hand or foot and you cannot properly move a finger or toe.  The wound is on your hand or foot and you notice that your fingers or toes look pale or bluish. This information is not intended to replace advice given to you by your health care provider. Make sure you discuss any questions you have with your health care provider. Document Released: 12/13/2004 Document Revised: 04/12/2016 Document Reviewed: 06/17/2013 Elsevier Interactive Patient Education  2017 [employer].

## 2017-03-18 NOTE — Progress Notes (Signed)
Patient presents to clinic today for her previously arranged excisional biopsy of a cutaneous horn on the left upper back.  Exam: 3 mm cutaneous horn left upper back.  Procedure: Consent obtained and timeout performed. Risks reviewed with patient who expresses understanding and agreement.  Skin cleaned with alcohol and cold spray applied and 3 ml of Marcaine with epinephrine injected subcutaneously longitudinal to the skin tension lines. The Marcaine with epinephrine was allowed to sit for 15 minutes achieving good blanching and numbness. The skin was sterilized with alcohol and using a sterile skin marker the planned excision was marked on the skin elliptical fashion. The wound was draped in the usual sterile fashion. A sharp incision was used to outline the planned excision track and the skin was undermined including the lesion. The total width of the excisional biopsy was approximately 5 millimeters in the length was about one and a half centimeters. A horizontal mattress suture and 2 simple interupted sutures are used to close the wound. Minimal bleeding Patient tolerated the procedure well.  Follow-up with PCP in one week for suture removal

## 2017-03-25 ENCOUNTER — Ambulatory Visit (INDEPENDENT_AMBULATORY_CARE_PROVIDER_SITE_OTHER): Payer: Medicare Other | Admitting: Physician Assistant

## 2017-03-25 VITALS — BP 119/81 | HR 65 | Wt 164.0 lb

## 2017-03-25 DIAGNOSIS — G894 Chronic pain syndrome: Secondary | ICD-10-CM | POA: Diagnosis not present

## 2017-03-25 DIAGNOSIS — Z4802 Encounter for removal of sutures: Secondary | ICD-10-CM | POA: Diagnosis not present

## 2017-03-25 DIAGNOSIS — I70209 Unspecified atherosclerosis of native arteries of extremities, unspecified extremity: Secondary | ICD-10-CM

## 2017-03-25 NOTE — Progress Notes (Signed)
HPI:                                                                [facility] is a 71 y.o. male who presents to [facility]: [place] today for suture removal  Suture / Staple Removal  The sutures were placed 7 to 10 days ago (left upper back). There has been no drainage from the wound. There is no redness present. There is no swelling present. There is no pain present. He has no difficulty moving the affected extremity or digit.   Patient also wished to discuss his chronic pain today. Patient states he requested his medical records for his attorney, who specializes in flu vaccine claims. Patient is upset that my previous office visits did not document his theory that his pain could have been caused by a possible vaccine reaction.   Patient also stated that he came here to find a diagnosis and get a fresh perspective; not for his general health or preventive care. He expresses frustration that "no doctor can tell me what is wrong."   Past Medical History:  Diagnosis Date  . Anxiety    chronic benzo use  . Chronic back pain   . Chronic pain disorder   . Depression   . Diabetes mellitus without complication ([place])   . History of heavy alcohol consumption   . Hypertension   . Insomnia   . Pancreatitis   . Small fiber neuropathy ([place]) 09/18/2016  . Tobacco use disorder    Past Surgical History:  Procedure Laterality Date  . HEMORROIDECTOMY     Social History  Substance Use Topics  . Smoking status: Current Every Day Smoker    Packs/day: 1.00    Years: 36.00    Types: Cigarettes  . Smokeless tobacco: Never Used  . Alcohol use No     Comment: former ETOH abuse, sober since 2007   family history is not on file. He was adopted.  ROS: negative except as noted in the HPI  Medications: Current Outpatient Prescriptions  Medication Sig Dispense Refill  . Aspirin-Caffeine (BC FAST PAIN RELIEF) 845-65 MG PACK 1 packet    . atorvastatin  (LIPITOR) 20 MG tablet Take 1 tablet (20 mg total) by mouth daily. 90 tablet 3  . busPIRone (BUSPAR) 15 MG tablet TAKE 1/2 TABLET TWICE DAILY    . hydrochlorothiazide (HYDRODIURIL) 25 MG tablet Take 1 tablet (25 mg total) by mouth daily. 30 tablet 2  . insulin NPH-regular Human (HUMULIN 70/30) (70-30) 100 UNIT/ML injection Slide Scale    . omeprazole (PRILOSEC) 40 MG capsule Take 40 mg by mouth daily.    Marland Kitchen oxyCODONE (ROXICODONE) 15 MG immediate release tablet Take by mouth.    . propranolol (INDERAL) 40 MG tablet Take by mouth.    . zolpidem (AMBIEN) 10 MG tablet Take one tablet (10 mg total) by mouth at bedtime as needed for Sleep. Take one tablet by mouth at bedtime as needed for sleep.     No current facility-administered medications for this visit.    Allergies  Allergen Reactions  . Beta Adrenergic Blockers     Other reaction(s): Other (See Comments) hypoglycemia  . Pregabalin     Other reaction(s): Other Weight gain   .  Ace Inhibitors     Other reaction(s): Unknown Weakness  . Codeine     REACTION: GI upset    Objective:  BP 119/81   Pulse 65   Wt 164 lb (74.4 kg)   BMI 23.87 kg/m  Gen: well-groomed, cooperative, not ill-appearing, no distress Pulm: Normal work of breathing, normal phonation Neuro: alert and oriented x 3, no tremor MSK: moving all extremities, normal gait and station, no peripheral edema Skin: warm, dry, intact; approx. 67mm well-healed surgical incision on the left upper back, no surrounding erythema or induration   No results found for this or any previous visit (from the past 72 hour(s)). No results found.    Assessment and Plan: 71 y.o. male with   1. 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 If not at goal, will add Atorvastatin  2. Visit for suture removal - 3 sutures removed from left shoulder without difficulty. Patient tolerated the procedure.   3. Chronic pain syndrome - had a long  discussion with patient. Explained that I could not document the vaccine as the cause of his condition as I was not a part of his treatment team at that time and we have no direct evidence for this. Explained that I do not have a medical explanation or diagnosis for his pain, but I suspect it is multifactorial and related to musculoskeletal/degenerative changes in the spine, uncontrolled diabetes. Perhaps the vaccine timing could have contributed, but again, I cannot speak to this. Explained that after reviewing his chart and his extensive work-ups, I feel there is less than a 1% chance we are going to find a better explanation or diagnosis for his pain. - explained that without abnormal lab findings or a diagnosis, there is nowhere to refer him. I recommended that Dr. Georgina Snell continue to manage his spine and musculoskeletal problems and that I could continue to manage his physical and emotional health. I recommended that he change the focus of his energy and goals from finding a diagnosis to achieving the quality of life he had before this pain started to include playing with his grandchildren and playing the piano again. I explained that there is nothing on his labs or imaging to suggest that he would not be able to achieve these things.   Patient education and anticipatory guidance given Patient agrees with treatment plan Follow-up in 3 months or sooner as needed if symptoms worsen or fail to improve  I spent 25 minutes with this patient, greater than 50% was face-to-face time counseling regarding the above diagnoses  Darlyne Russian PA-C

## 2017-03-25 NOTE — Telephone Encounter (Signed)
Results mailed -EH/RMA  

## 2017-03-26 DIAGNOSIS — R6882 Decreased libido: Secondary | ICD-10-CM | POA: Diagnosis not present

## 2017-03-26 DIAGNOSIS — I70209 Unspecified atherosclerosis of native arteries of extremities, unspecified extremity: Secondary | ICD-10-CM | POA: Diagnosis not present

## 2017-03-26 LAB — LIPID PANEL W/REFLEX DIRECT LDL
CHOL/HDL RATIO: 2.7 ratio (ref <5.0)
CHOLESTEROL: 126 mg/dL (ref <200)
HDL: 46 mg/dL (ref >40)
LDL-Cholesterol: 62 mg/dL
Non-HDL Cholesterol (Calc): 80 mg/dL (ref <130)
Triglycerides: 92 mg/dL (ref <150)

## 2017-03-27 ENCOUNTER — Ambulatory Visit: Payer: Self-pay | Admitting: Family Medicine

## 2017-03-27 LAB — TESTOSTERONE TOTAL,FREE,BIO, MALES
ALBUMIN: 4.1 g/dL (ref 3.6–5.1)
SEX HORMONE BINDING: 104 nmol/L — AB (ref 22–77)
TESTOSTERONE FREE: 27.4 pg/mL — AB (ref 46.0–224.0)
TESTOSTERONE: 573 ng/dL (ref 250–827)
Testosterone, Bioavailable: 51.7 ng/dL — ABNORMAL LOW (ref 110.0–575.0)

## 2017-04-04 ENCOUNTER — Ambulatory Visit: Payer: Medicare Other | Admitting: Physician Assistant

## 2017-04-10 ENCOUNTER — Ambulatory Visit: Payer: Self-pay | Admitting: Physician Assistant

## 2017-04-19 ENCOUNTER — Other Ambulatory Visit: Payer: Self-pay | Admitting: Physician Assistant

## 2017-04-19 ENCOUNTER — Telehealth: Payer: Self-pay

## 2017-04-19 DIAGNOSIS — F99 Mental disorder, not otherwise specified: Principal | ICD-10-CM

## 2017-04-19 DIAGNOSIS — F5105 Insomnia due to other mental disorder: Secondary | ICD-10-CM

## 2017-04-19 MED ORDER — TRAZODONE HCL 50 MG PO TABS: 25.0000 mg | ORAL_TABLET | Freq: Every evening | ORAL | 3 refills | Status: DC | PRN

## 2017-04-19 NOTE — Progress Notes (Signed)
Discontinued Ambien because patient is currently on Oxycodone QID. Prescription sent for Trazodone.

## 2017-04-19 NOTE — Telephone Encounter (Signed)
-----   Message from [facility], [place] sent at 04/19/2017  1:53 PM EDT ----- Regarding: Ambien Let [facility]er know I sent a sleep medication called Trazodone to his pharmacy. This medicine is not habit forming and does not interact with his other medications. He can take 1/2 or 1 tablet 30 minutes before bed. Ambien can interact with his oxycodone and there is an increased risk that he can stop breathing in his sleep.

## 2017-04-19 NOTE — Telephone Encounter (Signed)
Recommendations left on vm -EH/RMA  

## 2017-04-22 ENCOUNTER — Telehealth: Payer: Self-pay

## 2017-04-22 NOTE — Telephone Encounter (Signed)
At 4:52 pm on Friday pt left a voice mail stating that he is very upset that current PCP will not prescribe/refill Ambien.  He has threatened to go back to his former PCP Dr. Owens Shark if this medication is not filled.  He reports that he has been taking this medication for the last 10-12 years.  He feels that if Dr. Owens Shark didn't see a problem with him taking both Ambien and Oxycodone then why do his current PCP have a problem with it. I did explain in the voice mail that was left on Friday afternoon that the request for the ambien refill was denied due to him taking oxycodone.   Pt advised when taking both medications together could cause him to stop breathing in his sleep. Pt informed that trazodone would be sent to the pharmacy instead. He stated that trazodone is not the same as Ambien and it does not work the same.  He feels that you are making him stop this medication cold [place].  He has expressed that since establishing here at our office many things has been taken away from him including his xanax.  Will route to PCP for further review. -EH/RMA

## 2017-04-22 NOTE — Telephone Encounter (Signed)
Please let patient know that I am sorry he feels this way. I cannot speak to other provider's prescribing practices, but the reason for not filling the Ambien is for his safety. If he wishes to return to Dr. Owens Shark that remains his choice.

## 2017-04-23 NOTE — Telephone Encounter (Signed)
Pt notified -EH/RMA  

## 2017-04-29 DIAGNOSIS — M5136 Other intervertebral disc degeneration, lumbar region: Secondary | ICD-10-CM | POA: Diagnosis not present

## 2017-04-29 DIAGNOSIS — M5416 Radiculopathy, lumbar region: Secondary | ICD-10-CM | POA: Diagnosis not present

## 2017-04-29 DIAGNOSIS — Z79891 Long term (current) use of opiate analgesic: Secondary | ICD-10-CM | POA: Diagnosis not present

## 2017-04-29 DIAGNOSIS — M47816 Spondylosis without myelopathy or radiculopathy, lumbar region: Secondary | ICD-10-CM | POA: Diagnosis not present

## 2017-05-02 DIAGNOSIS — G629 Polyneuropathy, unspecified: Secondary | ICD-10-CM | POA: Diagnosis not present

## 2017-05-02 DIAGNOSIS — I70299 Other atherosclerosis of native arteries of extremities, unspecified extremity: Secondary | ICD-10-CM | POA: Diagnosis not present

## 2017-05-02 DIAGNOSIS — I700 Atherosclerosis of aorta: Secondary | ICD-10-CM | POA: Diagnosis not present

## 2017-05-02 DIAGNOSIS — F5101 Primary insomnia: Secondary | ICD-10-CM | POA: Diagnosis not present

## 2017-05-02 DIAGNOSIS — E1042 Type 1 diabetes mellitus with diabetic polyneuropathy: Secondary | ICD-10-CM | POA: Diagnosis not present

## 2017-05-02 DIAGNOSIS — G894 Chronic pain syndrome: Secondary | ICD-10-CM | POA: Diagnosis not present

## 2017-05-02 DIAGNOSIS — F411 Generalized anxiety disorder: Secondary | ICD-10-CM | POA: Diagnosis not present

## 2017-05-02 DIAGNOSIS — E1065 Type 1 diabetes mellitus with hyperglycemia: Secondary | ICD-10-CM | POA: Diagnosis not present

## 2017-05-02 DIAGNOSIS — I10 Essential (primary) hypertension: Secondary | ICD-10-CM | POA: Diagnosis not present

## 2017-05-06 DIAGNOSIS — E0842 Diabetes mellitus due to underlying condition with diabetic polyneuropathy: Secondary | ICD-10-CM | POA: Diagnosis not present

## 2017-05-06 DIAGNOSIS — I10 Essential (primary) hypertension: Secondary | ICD-10-CM | POA: Diagnosis not present

## 2017-05-06 DIAGNOSIS — I700 Atherosclerosis of aorta: Secondary | ICD-10-CM | POA: Diagnosis not present

## 2017-05-06 DIAGNOSIS — M546 Pain in thoracic spine: Secondary | ICD-10-CM | POA: Diagnosis not present

## 2017-05-06 DIAGNOSIS — I70299 Other atherosclerosis of native arteries of extremities, unspecified extremity: Secondary | ICD-10-CM | POA: Diagnosis not present

## 2017-05-07 DIAGNOSIS — E1042 Type 1 diabetes mellitus with diabetic polyneuropathy: Secondary | ICD-10-CM | POA: Diagnosis not present

## 2017-05-07 DIAGNOSIS — E1065 Type 1 diabetes mellitus with hyperglycemia: Secondary | ICD-10-CM | POA: Diagnosis not present

## 2017-06-26 DIAGNOSIS — M5416 Radiculopathy, lumbar region: Secondary | ICD-10-CM | POA: Diagnosis not present

## 2017-06-26 DIAGNOSIS — M5136 Other intervertebral disc degeneration, lumbar region: Secondary | ICD-10-CM | POA: Diagnosis not present

## 2017-06-26 DIAGNOSIS — Z79891 Long term (current) use of opiate analgesic: Secondary | ICD-10-CM | POA: Diagnosis not present

## 2017-06-26 DIAGNOSIS — M47816 Spondylosis without myelopathy or radiculopathy, lumbar region: Secondary | ICD-10-CM | POA: Diagnosis not present

## 2017-08-19 DIAGNOSIS — I700 Atherosclerosis of aorta: Secondary | ICD-10-CM | POA: Diagnosis not present

## 2017-08-19 DIAGNOSIS — R202 Paresthesia of skin: Secondary | ICD-10-CM | POA: Diagnosis not present

## 2017-08-19 DIAGNOSIS — Z79899 Other long term (current) drug therapy: Secondary | ICD-10-CM | POA: Diagnosis not present

## 2017-08-19 DIAGNOSIS — F17210 Nicotine dependence, cigarettes, uncomplicated: Secondary | ICD-10-CM | POA: Diagnosis not present

## 2017-08-19 DIAGNOSIS — F419 Anxiety disorder, unspecified: Secondary | ICD-10-CM | POA: Diagnosis not present

## 2017-08-19 DIAGNOSIS — I70299 Other atherosclerosis of native arteries of extremities, unspecified extremity: Secondary | ICD-10-CM | POA: Diagnosis not present

## 2017-08-19 DIAGNOSIS — F5101 Primary insomnia: Secondary | ICD-10-CM | POA: Diagnosis not present

## 2017-08-19 DIAGNOSIS — E1042 Type 1 diabetes mellitus with diabetic polyneuropathy: Secondary | ICD-10-CM | POA: Diagnosis not present

## 2017-08-19 DIAGNOSIS — E1065 Type 1 diabetes mellitus with hyperglycemia: Secondary | ICD-10-CM | POA: Diagnosis not present

## 2017-08-19 DIAGNOSIS — I10 Essential (primary) hypertension: Secondary | ICD-10-CM | POA: Diagnosis not present

## 2017-08-21 DIAGNOSIS — Z79891 Long term (current) use of opiate analgesic: Secondary | ICD-10-CM | POA: Diagnosis not present

## 2017-08-21 DIAGNOSIS — M5136 Other intervertebral disc degeneration, lumbar region: Secondary | ICD-10-CM | POA: Diagnosis not present

## 2017-08-21 DIAGNOSIS — M47816 Spondylosis without myelopathy or radiculopathy, lumbar region: Secondary | ICD-10-CM | POA: Diagnosis not present

## 2017-08-21 DIAGNOSIS — M5416 Radiculopathy, lumbar region: Secondary | ICD-10-CM | POA: Diagnosis not present

## 2017-10-02 DIAGNOSIS — E119 Type 2 diabetes mellitus without complications: Secondary | ICD-10-CM | POA: Diagnosis not present

## 2017-10-02 DIAGNOSIS — R202 Paresthesia of skin: Secondary | ICD-10-CM | POA: Diagnosis not present

## 2017-10-02 DIAGNOSIS — I6381 Other cerebral infarction due to occlusion or stenosis of small artery: Secondary | ICD-10-CM | POA: Diagnosis not present

## 2017-10-02 DIAGNOSIS — G373 Acute transverse myelitis in demyelinating disease of central nervous system: Secondary | ICD-10-CM | POA: Diagnosis not present

## 2017-10-02 DIAGNOSIS — Z8673 Personal history of transient ischemic attack (TIA), and cerebral infarction without residual deficits: Secondary | ICD-10-CM | POA: Diagnosis not present

## 2017-10-17 DIAGNOSIS — G373 Acute transverse myelitis in demyelinating disease of central nervous system: Secondary | ICD-10-CM | POA: Diagnosis not present

## 2017-10-17 DIAGNOSIS — M4696 Unspecified inflammatory spondylopathy, lumbar region: Secondary | ICD-10-CM | POA: Diagnosis not present

## 2017-10-17 DIAGNOSIS — I6381 Other cerebral infarction due to occlusion or stenosis of small artery: Secondary | ICD-10-CM | POA: Diagnosis not present

## 2017-10-17 DIAGNOSIS — M2578 Osteophyte, vertebrae: Secondary | ICD-10-CM | POA: Diagnosis not present

## 2017-10-17 DIAGNOSIS — M9971 Connective tissue and disc stenosis of intervertebral foramina of cervical region: Secondary | ICD-10-CM | POA: Diagnosis not present

## 2017-10-21 DIAGNOSIS — M47816 Spondylosis without myelopathy or radiculopathy, lumbar region: Secondary | ICD-10-CM | POA: Diagnosis not present

## 2017-10-21 DIAGNOSIS — Z79891 Long term (current) use of opiate analgesic: Secondary | ICD-10-CM | POA: Diagnosis not present

## 2017-10-21 DIAGNOSIS — M5136 Other intervertebral disc degeneration, lumbar region: Secondary | ICD-10-CM | POA: Diagnosis not present

## 2017-10-21 DIAGNOSIS — M5416 Radiculopathy, lumbar region: Secondary | ICD-10-CM | POA: Diagnosis not present

## 2017-10-30 DIAGNOSIS — F17210 Nicotine dependence, cigarettes, uncomplicated: Secondary | ICD-10-CM | POA: Diagnosis not present

## 2017-10-30 DIAGNOSIS — M791 Myalgia, unspecified site: Secondary | ICD-10-CM | POA: Diagnosis not present

## 2017-10-30 DIAGNOSIS — R202 Paresthesia of skin: Secondary | ICD-10-CM | POA: Diagnosis not present

## 2017-10-30 DIAGNOSIS — R200 Anesthesia of skin: Secondary | ICD-10-CM | POA: Diagnosis not present

## 2017-10-30 DIAGNOSIS — R450 Nervousness: Secondary | ICD-10-CM | POA: Diagnosis not present

## 2017-10-30 DIAGNOSIS — M2560 Stiffness of unspecified joint, not elsewhere classified: Secondary | ICD-10-CM | POA: Diagnosis not present

## 2017-12-11 DIAGNOSIS — Z79891 Long term (current) use of opiate analgesic: Secondary | ICD-10-CM | POA: Diagnosis not present

## 2017-12-11 DIAGNOSIS — M47816 Spondylosis without myelopathy or radiculopathy, lumbar region: Secondary | ICD-10-CM | POA: Diagnosis not present

## 2017-12-11 DIAGNOSIS — M5136 Other intervertebral disc degeneration, lumbar region: Secondary | ICD-10-CM | POA: Diagnosis not present

## 2017-12-11 DIAGNOSIS — M5416 Radiculopathy, lumbar region: Secondary | ICD-10-CM | POA: Diagnosis not present

## 2017-12-18 DIAGNOSIS — Z8673 Personal history of transient ischemic attack (TIA), and cerebral infarction without residual deficits: Secondary | ICD-10-CM | POA: Diagnosis not present

## 2017-12-18 DIAGNOSIS — R202 Paresthesia of skin: Secondary | ICD-10-CM | POA: Diagnosis not present

## 2018-01-06 DIAGNOSIS — Z79899 Other long term (current) drug therapy: Secondary | ICD-10-CM | POA: Diagnosis not present

## 2018-01-06 DIAGNOSIS — Z1159 Encounter for screening for other viral diseases: Secondary | ICD-10-CM | POA: Diagnosis not present

## 2018-01-06 DIAGNOSIS — I10 Essential (primary) hypertension: Secondary | ICD-10-CM | POA: Diagnosis not present

## 2018-01-06 DIAGNOSIS — R202 Paresthesia of skin: Secondary | ICD-10-CM | POA: Diagnosis not present

## 2018-01-06 DIAGNOSIS — F17210 Nicotine dependence, cigarettes, uncomplicated: Secondary | ICD-10-CM | POA: Diagnosis not present

## 2018-01-06 DIAGNOSIS — R350 Frequency of micturition: Secondary | ICD-10-CM | POA: Diagnosis not present

## 2018-01-06 DIAGNOSIS — Z1211 Encounter for screening for malignant neoplasm of colon: Secondary | ICD-10-CM | POA: Diagnosis not present

## 2018-01-06 DIAGNOSIS — F5101 Primary insomnia: Secondary | ICD-10-CM | POA: Diagnosis not present

## 2018-01-06 DIAGNOSIS — E1065 Type 1 diabetes mellitus with hyperglycemia: Secondary | ICD-10-CM | POA: Diagnosis not present

## 2018-01-06 DIAGNOSIS — I63541 Cerebral infarction due to unspecified occlusion or stenosis of right cerebellar artery: Secondary | ICD-10-CM | POA: Diagnosis not present

## 2018-01-06 DIAGNOSIS — E1042 Type 1 diabetes mellitus with diabetic polyneuropathy: Secondary | ICD-10-CM | POA: Diagnosis not present

## 2018-01-06 DIAGNOSIS — G629 Polyneuropathy, unspecified: Secondary | ICD-10-CM | POA: Diagnosis not present

## 2018-02-10 DIAGNOSIS — M5136 Other intervertebral disc degeneration, lumbar region: Secondary | ICD-10-CM | POA: Diagnosis not present

## 2018-02-10 DIAGNOSIS — M5416 Radiculopathy, lumbar region: Secondary | ICD-10-CM | POA: Diagnosis not present

## 2018-02-10 DIAGNOSIS — Z79891 Long term (current) use of opiate analgesic: Secondary | ICD-10-CM | POA: Diagnosis not present

## 2018-02-10 DIAGNOSIS — M47816 Spondylosis without myelopathy or radiculopathy, lumbar region: Secondary | ICD-10-CM | POA: Diagnosis not present

## 2018-03-06 DIAGNOSIS — R531 Weakness: Secondary | ICD-10-CM | POA: Diagnosis not present

## 2018-03-06 DIAGNOSIS — D72829 Elevated white blood cell count, unspecified: Secondary | ICD-10-CM | POA: Diagnosis not present

## 2018-04-02 DIAGNOSIS — R61 Generalized hyperhidrosis: Secondary | ICD-10-CM | POA: Diagnosis not present

## 2018-04-02 DIAGNOSIS — I639 Cerebral infarction, unspecified: Secondary | ICD-10-CM | POA: Diagnosis not present

## 2018-04-10 DIAGNOSIS — M47816 Spondylosis without myelopathy or radiculopathy, lumbar region: Secondary | ICD-10-CM | POA: Diagnosis not present

## 2018-04-10 DIAGNOSIS — M5136 Other intervertebral disc degeneration, lumbar region: Secondary | ICD-10-CM | POA: Diagnosis not present

## 2018-04-10 DIAGNOSIS — M5416 Radiculopathy, lumbar region: Secondary | ICD-10-CM | POA: Diagnosis not present

## 2018-04-10 DIAGNOSIS — Z79891 Long term (current) use of opiate analgesic: Secondary | ICD-10-CM | POA: Diagnosis not present

## 2018-05-19 DIAGNOSIS — I10 Essential (primary) hypertension: Secondary | ICD-10-CM | POA: Diagnosis not present

## 2018-05-19 DIAGNOSIS — E279 Disorder of adrenal gland, unspecified: Secondary | ICD-10-CM | POA: Diagnosis not present

## 2018-05-19 DIAGNOSIS — F17210 Nicotine dependence, cigarettes, uncomplicated: Secondary | ICD-10-CM | POA: Diagnosis not present

## 2018-05-19 DIAGNOSIS — R202 Paresthesia of skin: Secondary | ICD-10-CM | POA: Diagnosis not present

## 2018-05-19 DIAGNOSIS — R93421 Abnormal radiologic findings on diagnostic imaging of right kidney: Secondary | ICD-10-CM | POA: Diagnosis not present

## 2018-05-19 DIAGNOSIS — E1042 Type 1 diabetes mellitus with diabetic polyneuropathy: Secondary | ICD-10-CM | POA: Diagnosis not present

## 2018-05-19 DIAGNOSIS — G250 Essential tremor: Secondary | ICD-10-CM | POA: Diagnosis not present

## 2018-05-19 DIAGNOSIS — E1065 Type 1 diabetes mellitus with hyperglycemia: Secondary | ICD-10-CM | POA: Diagnosis not present

## 2018-05-19 DIAGNOSIS — I739 Peripheral vascular disease, unspecified: Secondary | ICD-10-CM | POA: Diagnosis not present

## 2018-06-05 DIAGNOSIS — M5416 Radiculopathy, lumbar region: Secondary | ICD-10-CM | POA: Diagnosis not present

## 2018-06-05 DIAGNOSIS — Z79891 Long term (current) use of opiate analgesic: Secondary | ICD-10-CM | POA: Diagnosis not present

## 2018-06-05 DIAGNOSIS — M47816 Spondylosis without myelopathy or radiculopathy, lumbar region: Secondary | ICD-10-CM | POA: Diagnosis not present

## 2018-06-05 DIAGNOSIS — M5136 Other intervertebral disc degeneration, lumbar region: Secondary | ICD-10-CM | POA: Diagnosis not present

## 2018-06-25 DIAGNOSIS — Z720 Tobacco use: Secondary | ICD-10-CM | POA: Diagnosis not present

## 2018-06-25 DIAGNOSIS — I739 Peripheral vascular disease, unspecified: Secondary | ICD-10-CM | POA: Diagnosis not present

## 2018-06-25 DIAGNOSIS — I10 Essential (primary) hypertension: Secondary | ICD-10-CM | POA: Diagnosis not present

## 2018-07-16 DIAGNOSIS — I739 Peripheral vascular disease, unspecified: Secondary | ICD-10-CM | POA: Diagnosis not present

## 2018-07-23 DIAGNOSIS — I10 Essential (primary) hypertension: Secondary | ICD-10-CM | POA: Diagnosis not present

## 2018-07-23 DIAGNOSIS — I739 Peripheral vascular disease, unspecified: Secondary | ICD-10-CM | POA: Diagnosis not present

## 2018-07-23 DIAGNOSIS — Z720 Tobacco use: Secondary | ICD-10-CM | POA: Diagnosis not present

## 2018-07-30 DIAGNOSIS — M5136 Other intervertebral disc degeneration, lumbar region: Secondary | ICD-10-CM | POA: Diagnosis not present

## 2018-07-30 DIAGNOSIS — Z79891 Long term (current) use of opiate analgesic: Secondary | ICD-10-CM | POA: Diagnosis not present

## 2018-07-30 DIAGNOSIS — M5416 Radiculopathy, lumbar region: Secondary | ICD-10-CM | POA: Diagnosis not present

## 2018-07-30 DIAGNOSIS — M47816 Spondylosis without myelopathy or radiculopathy, lumbar region: Secondary | ICD-10-CM | POA: Diagnosis not present

## 2018-08-15 DIAGNOSIS — I70203 Unspecified atherosclerosis of native arteries of extremities, bilateral legs: Secondary | ICD-10-CM | POA: Diagnosis not present

## 2018-08-15 DIAGNOSIS — D3501 Benign neoplasm of right adrenal gland: Secondary | ICD-10-CM | POA: Diagnosis not present

## 2018-08-15 DIAGNOSIS — I10 Essential (primary) hypertension: Secondary | ICD-10-CM | POA: Diagnosis not present

## 2018-08-15 DIAGNOSIS — I739 Peripheral vascular disease, unspecified: Secondary | ICD-10-CM | POA: Diagnosis not present

## 2018-08-15 DIAGNOSIS — K5730 Diverticulosis of large intestine without perforation or abscess without bleeding: Secondary | ICD-10-CM | POA: Diagnosis not present

## 2018-09-24 DIAGNOSIS — M47816 Spondylosis without myelopathy or radiculopathy, lumbar region: Secondary | ICD-10-CM | POA: Diagnosis not present

## 2018-09-24 DIAGNOSIS — M5416 Radiculopathy, lumbar region: Secondary | ICD-10-CM | POA: Diagnosis not present

## 2018-09-24 DIAGNOSIS — Z79891 Long term (current) use of opiate analgesic: Secondary | ICD-10-CM | POA: Diagnosis not present

## 2018-09-24 DIAGNOSIS — M5136 Other intervertebral disc degeneration, lumbar region: Secondary | ICD-10-CM | POA: Diagnosis not present

## 2018-10-09 DIAGNOSIS — I10 Essential (primary) hypertension: Secondary | ICD-10-CM | POA: Diagnosis not present

## 2018-10-09 DIAGNOSIS — F331 Major depressive disorder, recurrent, moderate: Secondary | ICD-10-CM | POA: Diagnosis not present

## 2018-10-09 DIAGNOSIS — E1065 Type 1 diabetes mellitus with hyperglycemia: Secondary | ICD-10-CM | POA: Diagnosis not present

## 2018-10-09 DIAGNOSIS — R202 Paresthesia of skin: Secondary | ICD-10-CM | POA: Diagnosis not present

## 2018-10-09 DIAGNOSIS — F17210 Nicotine dependence, cigarettes, uncomplicated: Secondary | ICD-10-CM | POA: Diagnosis not present

## 2018-10-09 DIAGNOSIS — F5101 Primary insomnia: Secondary | ICD-10-CM | POA: Diagnosis not present

## 2018-10-09 DIAGNOSIS — K861 Other chronic pancreatitis: Secondary | ICD-10-CM | POA: Diagnosis not present

## 2018-10-09 DIAGNOSIS — F419 Anxiety disorder, unspecified: Secondary | ICD-10-CM | POA: Diagnosis not present

## 2018-10-09 DIAGNOSIS — I63541 Cerebral infarction due to unspecified occlusion or stenosis of right cerebellar artery: Secondary | ICD-10-CM | POA: Diagnosis not present

## 2018-10-09 DIAGNOSIS — E278 Other specified disorders of adrenal gland: Secondary | ICD-10-CM | POA: Diagnosis not present

## 2018-10-09 DIAGNOSIS — E1042 Type 1 diabetes mellitus with diabetic polyneuropathy: Secondary | ICD-10-CM | POA: Diagnosis not present

## 2018-11-06 DIAGNOSIS — Z79891 Long term (current) use of opiate analgesic: Secondary | ICD-10-CM | POA: Diagnosis not present

## 2018-11-06 DIAGNOSIS — M47816 Spondylosis without myelopathy or radiculopathy, lumbar region: Secondary | ICD-10-CM | POA: Diagnosis not present

## 2018-11-06 DIAGNOSIS — M5416 Radiculopathy, lumbar region: Secondary | ICD-10-CM | POA: Diagnosis not present

## 2018-11-06 DIAGNOSIS — M5136 Other intervertebral disc degeneration, lumbar region: Secondary | ICD-10-CM | POA: Diagnosis not present

## 2019-05-28 ENCOUNTER — Other Ambulatory Visit: Payer: Self-pay

## 2019-05-28 ENCOUNTER — Emergency Department (INDEPENDENT_AMBULATORY_CARE_PROVIDER_SITE_OTHER): Payer: Medicare Other

## 2019-05-28 ENCOUNTER — Emergency Department (INDEPENDENT_AMBULATORY_CARE_PROVIDER_SITE_OTHER)
Admission: EM | Admit: 2019-05-28 | Discharge: 2019-05-28 | Disposition: A | Discharge status: Home / Self Care | Payer: Medicare Other | Source: Home / Self Care | Attending: Family Medicine | Admitting: Family Medicine

## 2019-05-28 DIAGNOSIS — M549 Dorsalgia, unspecified: Secondary | ICD-10-CM

## 2019-05-28 DIAGNOSIS — M545 Low back pain: Secondary | ICD-10-CM

## 2019-05-28 DIAGNOSIS — G8929 Other chronic pain: Secondary | ICD-10-CM

## 2019-05-28 MED ORDER — LIDOCAINE 5 % EX PTCH: MEDICATED_PATCH | CUTANEOUS | 0 refills | Status: AC

## 2019-05-28 MED ORDER — PREDNISONE 20 MG PO TABS: ORAL_TABLET | ORAL | 0 refills | Status: DC

## 2019-05-28 NOTE — ED Triage Notes (Signed)
Has had back problems since age 73.  Started Sunday night this week.  Was in bed, and got out and took 3 steps, and "tweaked" back.  Monday was in severe pain.  Pain in mid back, both sides.

## 2019-05-28 NOTE — ED Provider Notes (Signed)
[facility]    CSN: [id number] Arrival date & time: 05/28/19  1631     History   Chief Complaint Chief Complaint  Patient presents with  . Back Pain    HPI Steed Kanaan is a 73 y.o. male.   Five days ago, patient climbed out of bed, and felt a sudden mild pain in his mid-back (he states that he "tweaked it").  The pain increased and has persisted, but does not radiate.  He has a long history of recurring back pain, with previous MR scan revealing pronounced degenerative changes in the lumbar spine.   He denies bowel or bladder dysfunction, and no saddle numbness.   The history is provided by the patient.  Back Pain Location:  Thoracic spine and lumbar spine Quality:  Aching Radiates to:  Does not radiate Pain severity:  Moderate Pain is:  Same all the time Onset quality:  Sudden Duration:  5 days Timing:  Constant Progression:  Unchanged Chronicity:  Recurrent Context: twisting   Context: not falling, not jumping from heights, not lifting heavy objects, not MCA, not occupational injury, not pedestrian accident, not physical stress, not recent illness and not recent injury   Relieved by:  Nothing Worsened by:  Standing and ambulation Ineffective treatments: lidocaine patch. Associated symptoms: no abdominal pain, no bladder incontinence, no bowel incontinence, no chest pain, no dysuria, no fever, no leg pain, no numbness, no paresthesias, no pelvic pain, no perianal numbness, no tingling, no weakness and no weight loss     Past Medical History:  Diagnosis Date  . Anxiety    chronic benzo use  . Chronic back pain   . Chronic pain disorder   . Depression   . Diabetes mellitus without complication ([place])   . History of heavy alcohol consumption   . Hypertension   . Insomnia   . Pancreatitis   . Small fiber neuropathy 09/18/2016  . Tobacco use disorder     Patient Active Problem List   Diagnosis Date Noted  . Chronic pain syndrome 03/25/2017  . Knee  joint stiffness, bilateral 03/13/2017  . Polypharmacy 02/13/2017  . 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 Essential hypertension 02/07/2017  . Polymyalgia ([place]) 02/07/2017  . Severe episode of recurrent major depressive disorder, without psychotic features ([place]) 02/06/2017  . Musculoskeletal pain 01/10/2017  . Moderate episode of recurrent major depressive disorder ([place]) 01/10/2017  . 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 DJD (degenerative joint disease) of cervical spine 01/09/2017  . Lumbar radiculopathy, chronic 01/09/2017  . Lumbar spondylolysis 01/09/2017  . Chronic calcific pancreatitis ([place]) 01/09/2017  . Right adrenal mass ([place]) 01/09/2017  . Aorto-iliac atherosclerosis ([place]) 01/09/2017  . Diverticulosis 01/09/2017  . Renal cyst, left 01/09/2017  . Paresthesia 09/18/2016  . Small fiber neuropathy 09/18/2016  . Tobacco use disorder 11/25/2014  . DIABETES MELLITUS, TYPE I 06/29/2010  . ANXIETY 06/29/2010    Past Surgical History:  Procedure Laterality Date  . HEMORROIDECTOMY         Home Medications    Prior to Admission medications   Medication Sig Start Date End Date Taking? Authorizing Provider  escitalopram (LEXAPRO) 10 MG tablet Take 10 mg by mouth daily.   Yes [provider]  insulin regular (NOVOLIN R) 100 units/mL injection Inject into the skin 3 (three) times daily before meals.   Yes [provider]  Aspirin-Caffeine (BC FAST PAIN RELIEF) 845-65 MG PACK 1 packet    [provider]  busPIRone (BUSPAR) 15 MG tablet TAKE 1/2 TABLET TWICE DAILY 07/20/16   [provider]  hydrochlorothiazide (HYDRODIURIL) 25 MG tablet Take 1 tablet (25 mg total) by mouth daily. 02/07/17   Gregor Hams, MD  insulin NPH-regular Human (HUMULIN 70/30) (70-30) 100 UNIT/ML injection Slide Scale    [provider]  lidocaine (LIDODERM) 5 % Apply one patch to affected area daily, and remove after 12 hours  05/28/19   Kandra Nicolas, MD  omeprazole (PRILOSEC) 40 MG capsule Take 40 mg by mouth daily.    [provider]  oxyCODONE (ROXICODONE) 15 MG immediate release tablet Take by mouth. 03/27/16   [provider]  predniSONE (DELTASONE) 20 MG tablet Take one tab by mouth twice daily for 4 days, then one daily for 3 days. Take with food. 05/28/19   Kandra Nicolas, MD  propranolol (INDERAL) 40 MG tablet Take by mouth. 05/18/16   [provider]  traZODone (DESYREL) 50 MG tablet Take 0.5-1 tablets (25-50 mg total) by mouth at bedtime as needed for sleep. 04/19/17   Trixie Dredge, PA-C    Family History Family History  Adopted: Yes    Social History Social History   Tobacco Use  . Smoking status: Current Every Day Smoker    Packs/day: 1.00    Years: 36.00    Pack years: 36.00    Types: Cigarettes  . Smokeless tobacco: Never Used  Substance Use Topics  . Alcohol use: No    Comment: former ETOH abuse, sober since 2007  . Drug use: No     Allergies   Beta adrenergic blockers, Pregabalin, Ace inhibitors, and Codeine   Review of Systems Review of Systems  Constitutional: Positive for activity change. Negative for chills, diaphoresis, fatigue, fever, unexpected weight change and weight loss.  HENT: Negative.   Eyes: Negative.   Respiratory: Negative.   Cardiovascular: Negative for chest pain.  Gastrointestinal: Negative for abdominal pain and bowel incontinence.  Genitourinary: Negative for bladder incontinence, dysuria and pelvic pain.  Musculoskeletal: Positive for back pain.  Skin: Negative.   Neurological: Negative for tingling, weakness, numbness and paresthesias.     Physical Exam Triage Vital Signs ED Triage Vitals  Enc Vitals Group     BP 05/28/19 1659 117/73     Pulse Rate 05/28/19 1659 67     Resp 05/28/19 1659 20     Temp 05/28/19 1659 97.7 F (36.5 C)     Temp src --      SpO2 05/28/19 1659 99 %     Weight 05/28/19 1709  177 lb (80.3 kg)     Height 05/28/19 1709 5\' 9"  (1.753 m)     Head Circumference --      Peak Flow --      Pain Score 05/28/19 1706 10     Pain Loc --      Pain Edu? --      Excl. in [place]? --    No data found.  Updated Vital Signs BP 117/73 (BP Location: Right Arm)   Pulse 67   Temp 97.7 F (36.5 C)   Resp 20   Ht 5\' 9"  (1.753 m)   Wt 80.3 kg   SpO2 99%   BMI 26.14 kg/m   Visual Acuity Right Eye Distance:   Left Eye Distance:   Bilateral Distance:    Right Eye Near:   Left Eye Near:    Bilateral Near:     Physical Exam Vitals signs  and nursing note reviewed.  Constitutional:      General: He is not in acute distress. HENT:     Head: Normocephalic.     Right Ear: External ear normal.     Left Ear: External ear normal.     Mouth/Throat:     Pharynx: Oropharynx is clear.  Eyes:     Conjunctiva/sclera: Conjunctivae normal.     Pupils: Pupils are equal, round, and reactive to light.  Neck:     Musculoskeletal: Normal range of motion.  Cardiovascular:     Heart sounds: Normal heart sounds.  Pulmonary:     Breath sounds: Normal breath sounds.  Abdominal:     Tenderness: There is no abdominal tenderness.  Musculoskeletal:     Lumbar back: He exhibits decreased range of motion and tenderness. He exhibits no bony tenderness, no swelling and no edema.       Back:     Right lower leg: No edema.     Left lower leg: No edema.     Comments: Back:  Range of motion decreased.  Can heel/toe walk and squat without difficulty.  Tenderness in the bilateral paraspinous muscles from lower thoracic to mid-lumbar area.  Straight leg raising test is negative.  Sitting knee extension test is negative.  Strength and sensation in the lower extremities is normal.  Patellar and achilles reflexes are normal   Skin:    General: Skin is warm and dry.     Findings: No rash.  Neurological:     Mental Status: He is alert.     Deep Tendon Reflexes: Reflexes normal.      UC Treatments /  Results  Labs (all labs ordered are listed, but only abnormal results are displayed) Labs Reviewed - No data to display  EKG   Radiology Dg Lumbar Spine Complete  Result Date: 05/28/2019 CLINICAL DATA:  Chronic low back pain which is increased over the past several days, initial encounter, no known injury EXAM: LUMBAR SPINE - COMPLETE 4+ VIEW COMPARISON:  None. FINDINGS: Five lumbar type vertebral bodies are well visualized. Vertebral body height is well maintained. No pars defects are noted. Mild osteophytic changes and facet hypertrophic changes are noted. No anterolisthesis is noted. No soft tissue abnormality is seen. IMPRESSION: Degenerative change without acute abnormality. Electronically Signed   By: Inez Catalina M.D.   On: 05/28/2019 18:02    Procedures Procedures (including critical care time)  Medications Ordered in UC Medications - No data to display  Initial Impression / Assessment and Plan / UC Course  I have reviewed the triage vital signs and the nursing notes.  Pertinent labs & imaging results that were available during my care of the patient were reviewed by me and considered in my medical decision making (see chart for details).    No evidence acute vertebral compression fracture. Suspect muscular strain. Begin prednisone burst/taper.  Rx for daily lidocaine patch. Followup with Family Doctor if not improved in one week.    Final Clinical Impressions(s) / UC Diagnoses   Final diagnoses:  Acute mid back pain     Discharge Instructions     Apply ice pack for 20 to 30 minutes, 3 to 4 times daily  Continue until pain decreases. May take Tylenol as needed for pain while taking prednisone.    ED Prescriptions    Medication Sig Dispense Auth. Provider   predniSONE (DELTASONE) 20 MG tablet Take one tab by mouth twice daily for 4 days, then one daily  for 3 days. Take with food. 11 tablet Kandra Nicolas, MD   lidocaine (LIDODERM) 5 % Apply one patch to affected  area daily, and remove after 12 hours 30 patch Ass[place] Found Ishmael Holter, MD         Kandra Nicolas, MD 05/29/19 1304

## 2019-05-28 NOTE — Discharge Instructions (Addendum)
Apply ice pack for 20 to 30 minutes, 3 to 4 times daily  Continue until pain decreases. May take Tylenol as needed for pain while taking prednisone.

## 2019-11-25 IMAGING — DX LUMBAR SPINE - COMPLETE 4+ VIEW
5 series · 5 of 5 positions shown · non-contrast
Comparison: None.

CLINICAL DATA: Chronic low back pain which is increased over the
past several days, initial encounter, no known injury

EXAM:
LUMBAR SPINE - COMPLETE 4+ VIEW

[l-spine ap]
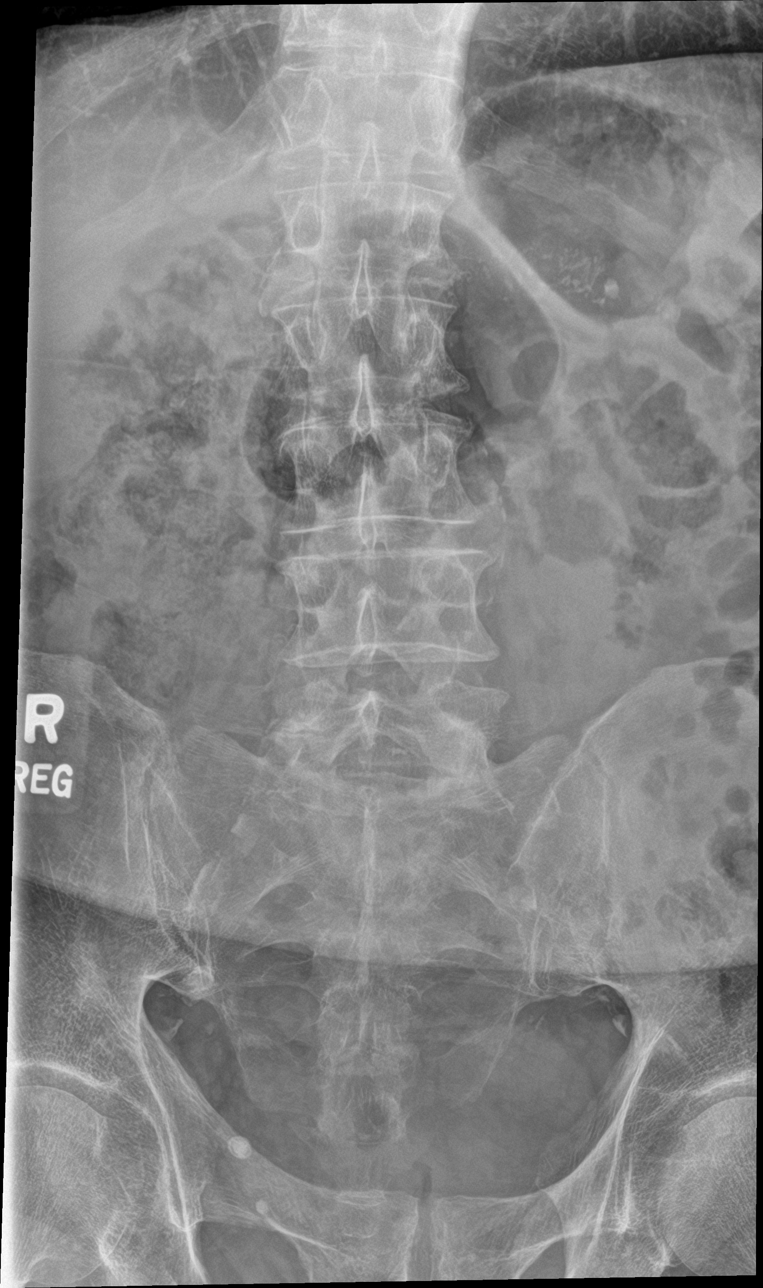

[l-spine obl (1 of 2)]
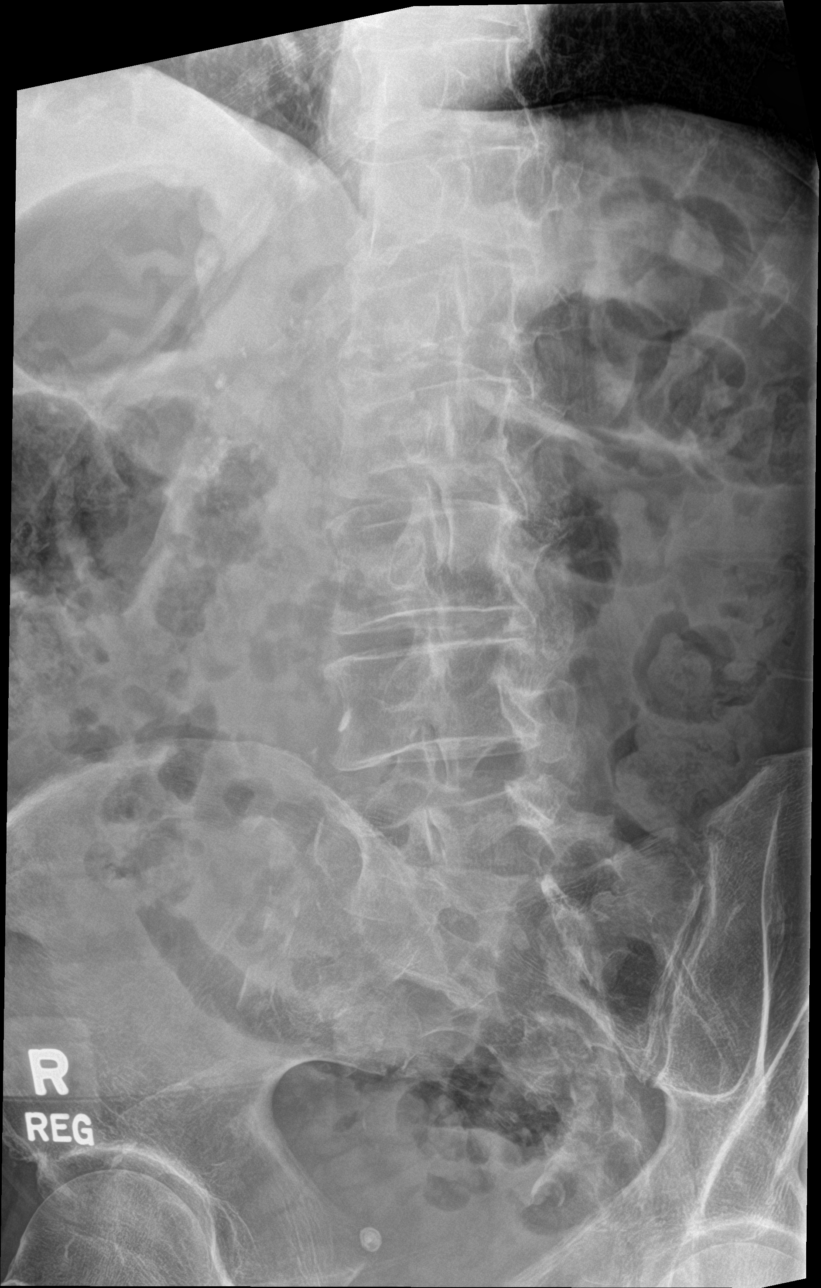

[l-spine obl (2 of 2)]
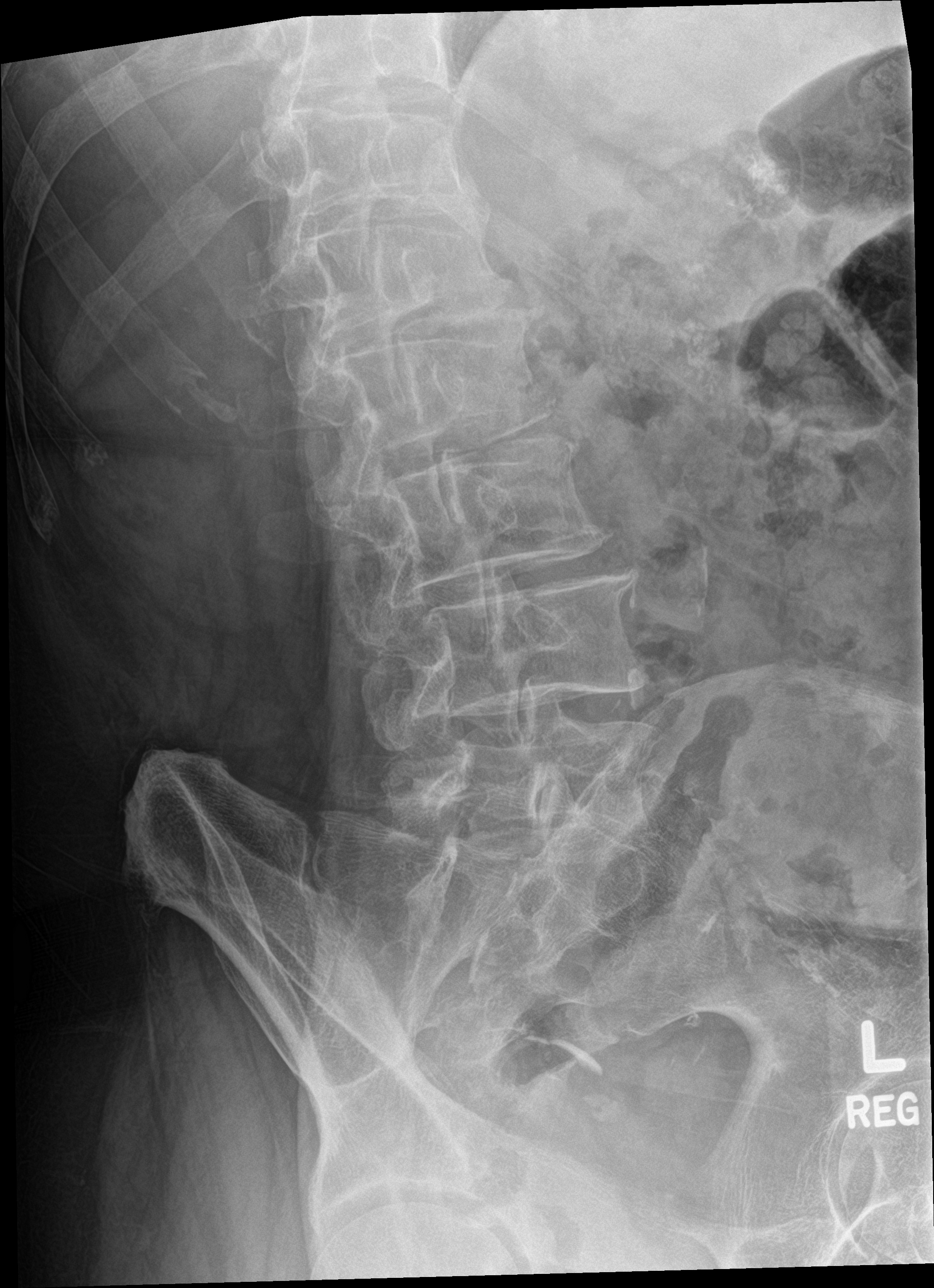

[l-spine lat]
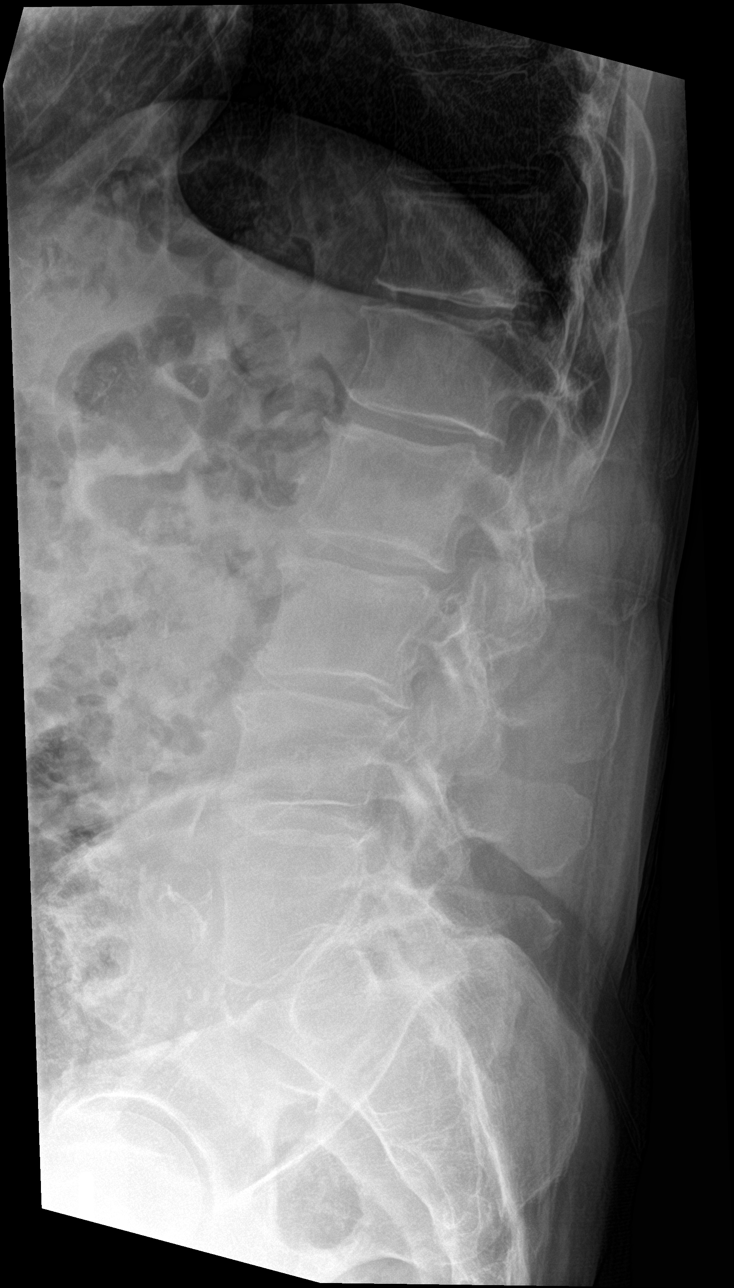

[l-spine spot]
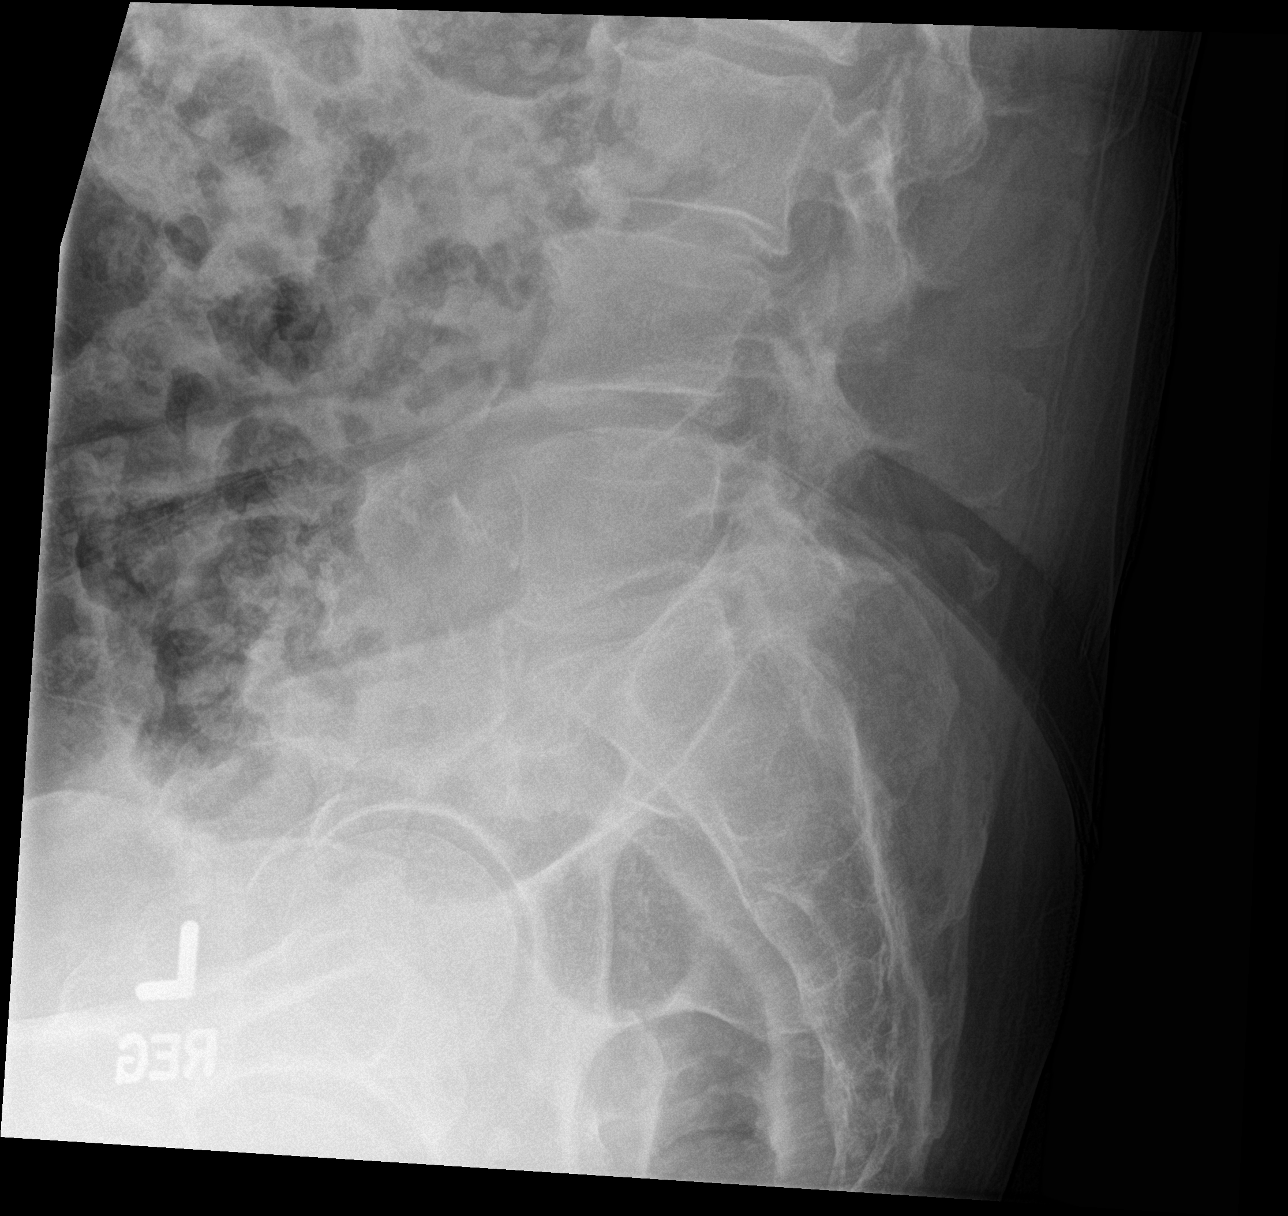

[5 of 5 positions shown; findings below may reference images not displayed]

FINDINGS: Five lumbar type vertebral bodies are well visualized. Vertebral
body height is well maintained. No pars defects are noted. Mild
osteophytic changes and facet hypertrophic changes are noted. No
anterolisthesis is noted. No soft tissue abnormality is seen.
IMPRESSION: Degenerative change without acute abnormality.

## 2019-11-25 IMAGING — DX THORACIC SPINE - 3 VIEWS
3 series · 3 of 3 positions shown · non-contrast
Comparison: None.

CLINICAL DATA: Chronic back pain.

EXAM:
THORACIC SPINE - 3 VIEWS

[t-spine ap]
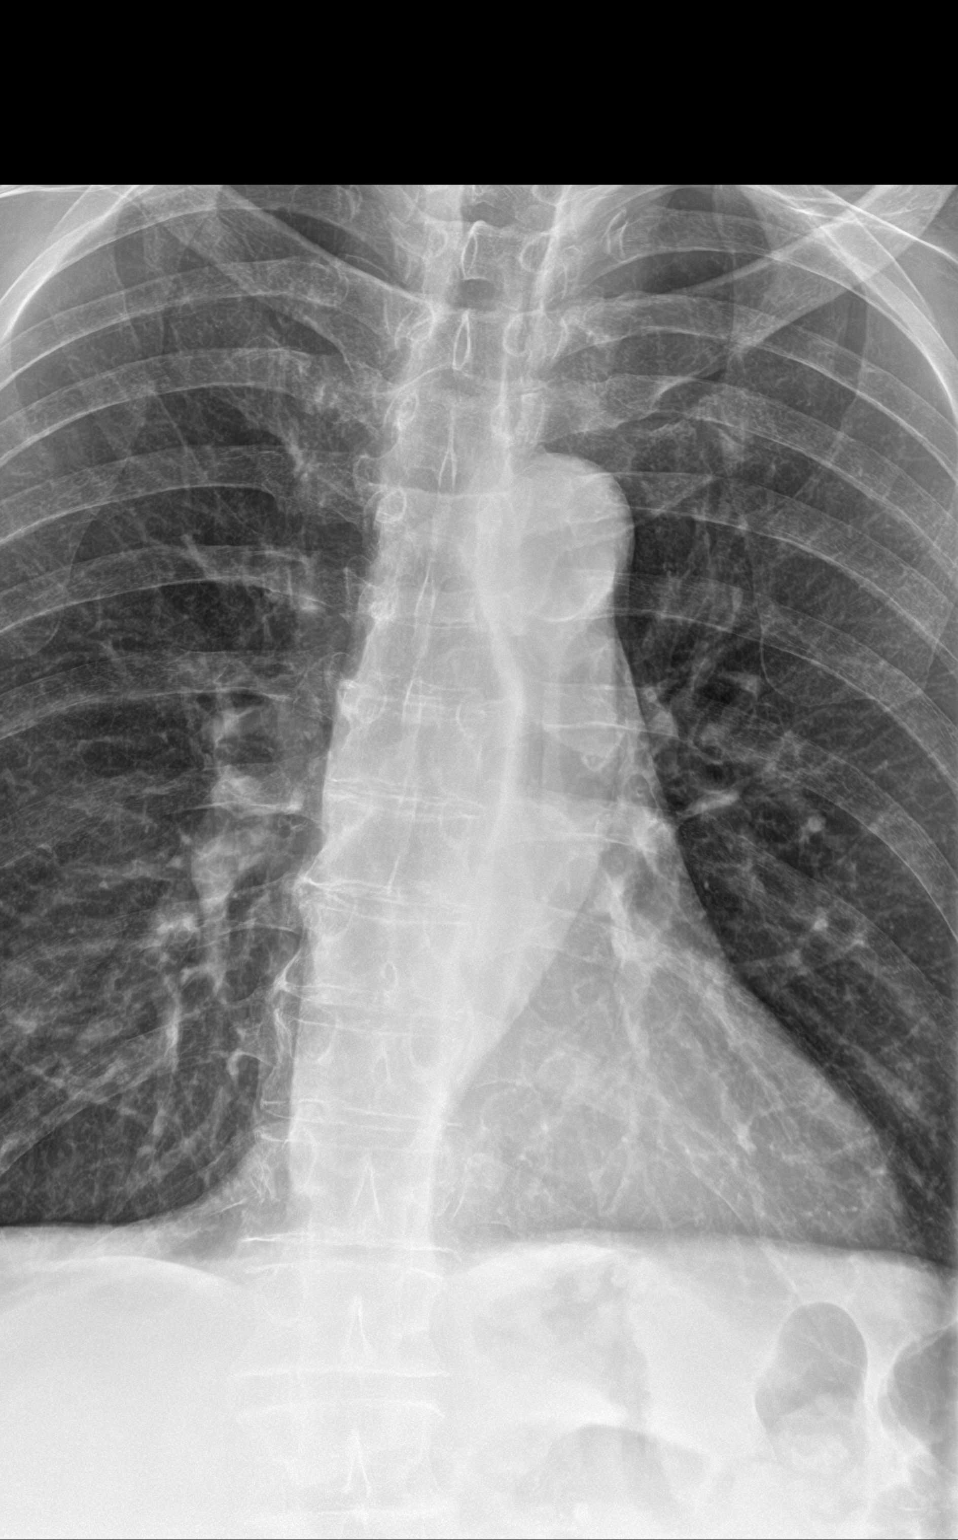

[t-spine lat]
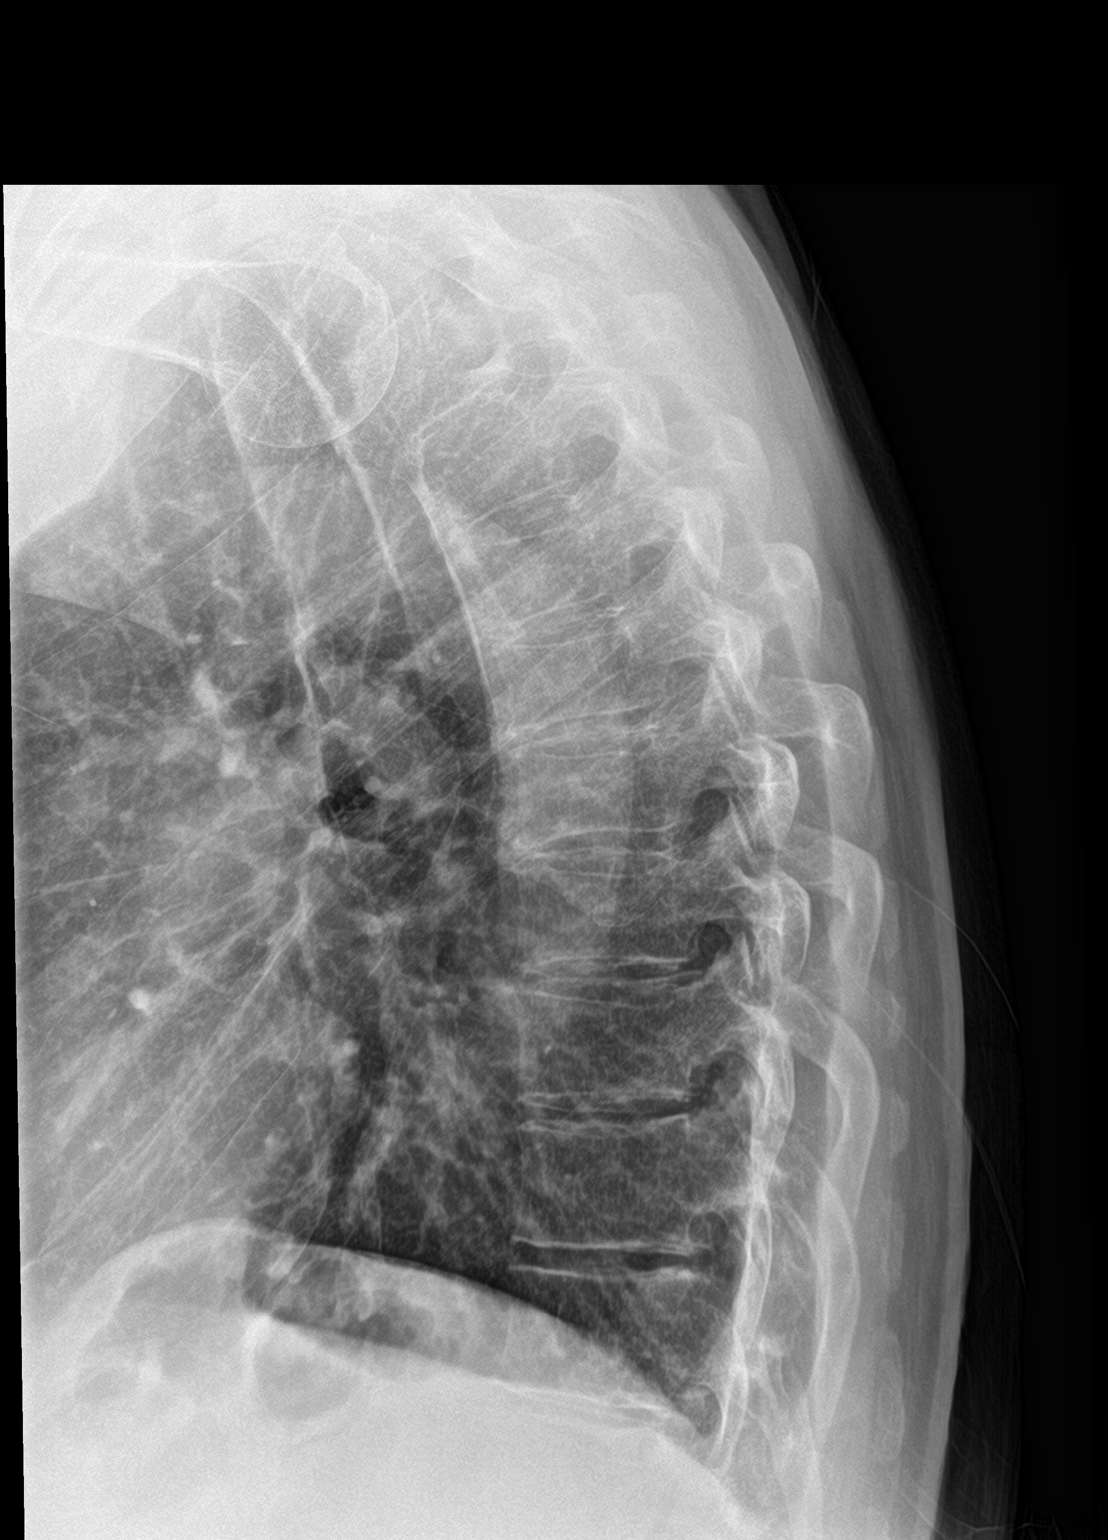

[t-spine swimmers]
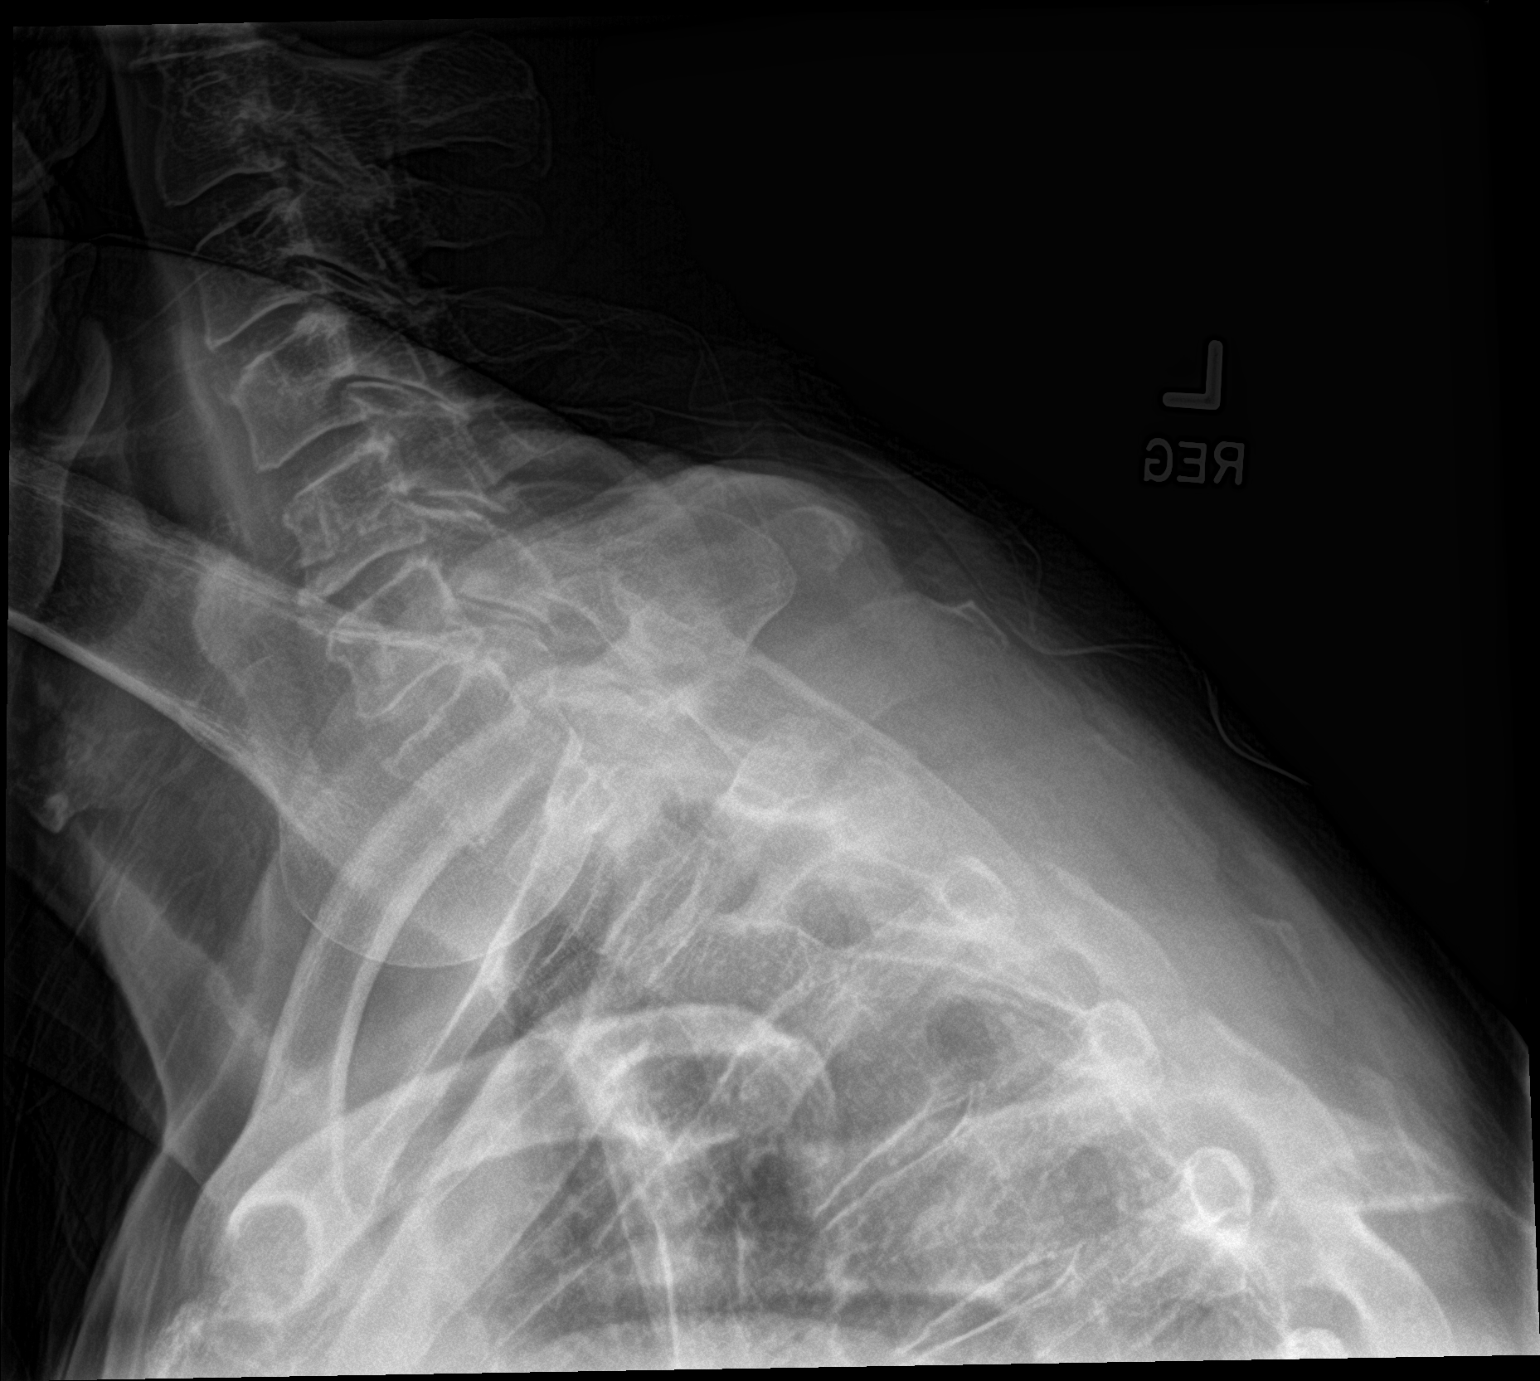

[3 of 3 positions shown; findings below may reference images not displayed]

FINDINGS: There is no evidence of thoracic spine fracture. Alignment is
normal. No other significant bone abnormalities are identified.
IMPRESSION: Negative.

## 2020-05-11 ENCOUNTER — Other Ambulatory Visit: Payer: Self-pay

## 2020-05-11 ENCOUNTER — Encounter: Payer: Self-pay | Admitting: *Deleted

## 2020-05-11 ENCOUNTER — Emergency Department (INDEPENDENT_AMBULATORY_CARE_PROVIDER_SITE_OTHER)
Admission: EM | Admit: 2020-05-11 | Discharge: 2020-05-11 | Disposition: A | Discharge status: Home / Self Care | Payer: Medicare Other | Source: Home / Self Care | Attending: Family Medicine | Admitting: Family Medicine

## 2020-05-11 DIAGNOSIS — M545 Low back pain, unspecified: Secondary | ICD-10-CM

## 2020-05-11 MED ORDER — PREDNISONE 20 MG PO TABS: ORAL_TABLET | ORAL | 0 refills | Status: AC

## 2020-05-11 MED ORDER — CYCLOBENZAPRINE HCL 10 MG PO TABS: ORAL_TABLET | ORAL | 0 refills | Status: AC

## 2020-05-11 NOTE — Discharge Instructions (Addendum)
Apply ice pack for 20 to 30 minutes, 3 to 4 times daily  Continue until pain and swelling decrease.  May take Tylenol as needed for pain. 

## 2020-05-11 NOTE — ED Provider Notes (Signed)
[facility]    CSN: [id number] Arrival date & time: 05/11/20  0804      History   Chief Complaint Chief Complaint  Patient presents with  . Back Pain    HPI Mark Brewer is a 74 y.o. male.   While shaving and brushing teeth ten days ago patient suddenly developed lower back pain that has persisted and now extends upwards to his mid-back.  He denies lower extremity radicular pain.   He denies bowel or bladder dysfunction, and no saddle numbness.  He denies recent injury or change in physical activities.  He has had similar spontaneous pain every several years.  He has had some improvement with a back brace and support pillows.   The history is provided by the patient.  Back Pain Location:  Lumbar spine and thoracic spine Quality:  Aching Radiates to:  Does not radiate Pain severity:  Moderate Pain is:  Same all the time Onset quality:  Sudden Duration:  10 days Timing:  Constant Progression:  Unchanged Chronicity:  Recurrent Context: not lifting heavy objects, not MCA, not physical stress, not recent injury and not twisting   Relieved by:  Nothing Worsened by:  Movement Associated symptoms: no abdominal pain, no bladder incontinence, no bowel incontinence, no chest pain, no dysuria, no fever, no leg pain, no numbness, no paresthesias, no pelvic pain, no perianal numbness, no tingling and no weakness     Past Medical History:  Diagnosis Date  . Anxiety    chronic benzo use  . Chronic back pain   . Chronic pain disorder   . Depression   . Diabetes mellitus without complication ([place])   . History of heavy alcohol consumption   . Hypertension   . Insomnia   . Pancreatitis   . Small fiber neuropathy 09/18/2016  . Tobacco use disorder     Patient Active Problem List   Diagnosis Date Noted  . Chronic pain syndrome 03/25/2017  . Knee joint stiffness, bilateral 03/13/2017  . Polypharmacy 02/13/2017  . Uncontrolled type 1 diabetes mellitus with diabetic  polyneuropathy ([place]) 02/07/2017  . Essential hypertension 02/07/2017  . Polymyalgia ([place]) 02/07/2017  . Severe episode of recurrent major depressive disorder, without psychotic features ([place]) 02/06/2017  . Musculoskeletal pain 01/10/2017  . Moderate episode of recurrent major depressive disorder ([place]) 01/10/2017  . 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 DJD (degenerative joint disease) of cervical spine 01/09/2017  . Lumbar radiculopathy, chronic 01/09/2017  . Lumbar spondylolysis 01/09/2017  . Chronic calcific pancreatitis ([place]) 01/09/2017  . Right adrenal mass ([place]) 01/09/2017  . Aorto-iliac atherosclerosis ([place]) 01/09/2017  . Diverticulosis 01/09/2017  . Renal cyst, left 01/09/2017  . Paresthesia 09/18/2016  . Small fiber neuropathy 09/18/2016  . Tobacco use disorder 11/25/2014  . DIABETES MELLITUS, TYPE I 06/29/2010  . ANXIETY 06/29/2010    Past Surgical History:  Procedure Laterality Date  . HEMORROIDECTOMY         Home Medications    Prior to Admission medications   Medication Sig Start Date End Date Taking? Authorizing Provider  cyclobenzaprine (FLEXERIL) 10 MG tablet Take one tab PO once or twice daily prn muscle spasm 05/11/20   Kandra Nicolas, MD  hydrochlorothiazide (HYDRODIURIL) 25 MG tablet Take 1 tablet (25 mg total) by mouth daily. 02/07/17   Gregor Hams, MD  insulin regular (NOVOLIN R) 100 units/mL injection Inject into the skin 3 (three) times daily before meals.    [provider]  lidocaine (LIDODERM) 5 % Apply one  patch to affected area daily, and remove after 12 hours 05/28/19   Kandra Nicolas, MD  omeprazole (PRILOSEC) 40 MG capsule Take 40 mg by mouth daily.    [provider]  oxyCODONE (ROXICODONE) 15 MG immediate release tablet Take by mouth. 03/27/16   [provider]  predniSONE (DELTASONE) 20 MG tablet Take one tab by mouth twice daily for 4 days, then one daily for 3 days. Take with food. 05/11/20   Kandra Nicolas, MD    propranolol (INDERAL) 40 MG tablet Take by mouth. 05/18/16   [provider]    Family History Family History  Adopted: Yes    Social History Social History   Tobacco Use  . Smoking status: Current Every Day Smoker    Packs/day: 1.00    Years: 36.00    Pack years: 36.00    Types: Cigarettes  . Smokeless tobacco: Never Used  Substance Use Topics  . Alcohol use: No    Comment: former ETOH abuse, sober since 2007  . Drug use: No     Allergies   Beta adrenergic blockers, Pregabalin, Ace inhibitors, and Codeine   Review of Systems Review of Systems  Constitutional: Negative for chills, diaphoresis, fatigue and fever.  Cardiovascular: Negative for chest pain.  Gastrointestinal: Negative for abdominal pain and bowel incontinence.  Genitourinary: Negative for bladder incontinence, dysuria, flank pain, frequency, hematuria, pelvic pain and urgency.  Musculoskeletal: Positive for back pain.  Neurological: Negative for tingling, weakness, numbness and paresthesias.  All other systems reviewed and are negative.    Physical Exam Triage Vital Signs ED Triage Vitals  Enc Vitals Group     BP 05/11/20 0825 129/76     Pulse Rate 05/11/20 0825 60     Resp 05/11/20 0825 16     Temp 05/11/20 0825 97.9 F (36.6 C)     Temp Source 05/11/20 0825 Oral     SpO2 05/11/20 0825 97 %     Weight 05/11/20 0821 169 lb (76.7 kg)     Height 05/11/20 0821 5\' 9"  (1.753 m)     Head Circumference --      Peak Flow --      Pain Score 05/11/20 0822 8     Pain Loc --      Pain Edu? --      Excl. in [place]? --    No data found.  Updated Vital Signs BP 129/76 (BP Location: Right Arm)   Pulse 60   Temp 97.9 F (36.6 C) (Oral)   Resp 16   Ht 5\' 9"  (1.753 m)   Wt 76.7 kg   SpO2 97%   BMI 24.96 kg/m   Visual Acuity Right Eye Distance:   Left Eye Distance:   Bilateral Distance:    Right Eye Near:   Left Eye Near:    Bilateral Near:     Physical Exam Vitals and nursing note  reviewed.  Constitutional:      General: He is not in acute distress. HENT:     Head: Normocephalic.     Right Ear: External ear normal.     Left Ear: External ear normal.     Nose: Nose normal.     Mouth/Throat:     Pharynx: Oropharynx is clear.  Eyes:     Conjunctiva/sclera: Conjunctivae normal.     Pupils: Pupils are equal, round, and reactive to light.  Cardiovascular:     Rate and Rhythm: Regular rhythm.     Heart  sounds: Normal heart sounds.  Pulmonary:     Breath sounds: Normal breath sounds.  Abdominal:     Tenderness: There is no abdominal tenderness. There is no right CVA tenderness or left CVA tenderness.  Musculoskeletal:     Cervical back: Neck supple. No tenderness.       Back:     Right lower leg: No edema.     Left lower leg: No edema.     Comments: Back:  Range of motion relatively well preserved.  Can heel/toe walk and squat without difficulty. Tenderness in bilateral thoracic paraspinous muscles.  Tenderness over bilateral SI joints. Straight leg raising test is negative.  Sitting knee extension test is negative.  Strength and sensation in the lower extremities is normal.   Achilles reflexes are present.  Patellar reflexes are absent (normal for patient).  Lymphadenopathy:     Cervical: No cervical adenopathy.  Skin:    General: Skin is warm and dry.     Findings: No rash.  Neurological:     Mental Status: He is alert.      UC Treatments / Results  Labs (all labs ordered are listed, but only abnormal results are displayed) Labs Reviewed - No data to display  EKG   Radiology No results found.  Procedures Procedures (including critical care time)  Medications Ordered in UC Medications - No data to display  Initial Impression / Assessment and Plan / UC Course  I have reviewed the triage vital signs and the nursing notes.  Pertinent labs & imaging results that were available during my care of the patient were reviewed by me and considered in my  medical decision making (see chart for details).    Begin prednisone burst/taper.  Rx for Flexeril. Followup with Dr. Aundria Mems ([facility]) if not improving about two weeks.    Final Clinical Impressions(s) / UC Diagnoses   Final diagnoses:  Recurrent low back pain     Discharge Instructions     Apply ice pack for 20 to 30 minutes, 3 to 4 times daily  Continue until pain and swelling decrease.  May take Tylenol as needed for pain.    ED Prescriptions    Medication Sig Dispense Auth. Provider   predniSONE (DELTASONE) 20 MG tablet Take one tab by mouth twice daily for 4 days, then one daily for 3 days. Take with food. 11 tablet Kandra Nicolas, MD   cyclobenzaprine (FLEXERIL) 10 MG tablet Take one tab PO once or twice daily prn muscle spasm 15 tablet Kandra Nicolas, MD        Kandra Nicolas, MD 05/15/20 1240

## 2020-05-11 NOTE — ED Triage Notes (Signed)
Pt c/o RT side low back pain x 04/30/20. Denies injury. He was brushing his teeth. He reports a hx of similar pain about every 5 years. Used back brace and support pillows.

## 2020-12-20 DEATH — deceased

## 2021-01-17 DEATH — deceased
